# Patient Record
Sex: Female | Born: 1995 | Hispanic: No | Marital: Single | Smoking: Current some day smoker
Health system: Southern US, Community
[De-identification: ages and names within clinical notes are randomized; demographics above are authoritative.]

## PROBLEM LIST (undated history)

## (undated) DIAGNOSIS — T7840XA Allergy, unspecified, initial encounter: Secondary | ICD-10-CM

## (undated) DIAGNOSIS — H539 Unspecified visual disturbance: Secondary | ICD-10-CM

## (undated) DIAGNOSIS — F411 Generalized anxiety disorder: Secondary | ICD-10-CM

## (undated) HISTORY — DX: Generalized anxiety disorder: F41.1

## (undated) HISTORY — PX: TONSILLECTOMY: SUR1361

---
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
## 2015-10-10 ENCOUNTER — Other Ambulatory Visit: Payer: Self-pay

## 2015-12-19 ENCOUNTER — Encounter (HOSPITAL_COMMUNITY): Payer: Self-pay | Admitting: Nurse Practitioner

## 2015-12-19 ENCOUNTER — Emergency Department (HOSPITAL_COMMUNITY)
Admission: EM | Admit: 2015-12-19 | Discharge: 2015-12-19 | Disposition: A | Discharge status: Home / Self Care | Payer: BC Managed Care – PPO | Attending: Emergency Medicine | Admitting: Emergency Medicine

## 2015-12-19 DIAGNOSIS — Z79899 Other long term (current) drug therapy: Secondary | ICD-10-CM | POA: Insufficient documentation

## 2015-12-19 DIAGNOSIS — Z880 Allergy status to penicillin: Secondary | ICD-10-CM | POA: Diagnosis not present

## 2015-12-19 DIAGNOSIS — R202 Paresthesia of skin: Secondary | ICD-10-CM | POA: Diagnosis not present

## 2015-12-19 DIAGNOSIS — F17200 Nicotine dependence, unspecified, uncomplicated: Secondary | ICD-10-CM | POA: Diagnosis not present

## 2015-12-19 DIAGNOSIS — Z8669 Personal history of other diseases of the nervous system and sense organs: Secondary | ICD-10-CM | POA: Diagnosis not present

## 2015-12-19 DIAGNOSIS — M542 Cervicalgia: Secondary | ICD-10-CM

## 2015-12-19 DIAGNOSIS — R52 Pain, unspecified: Secondary | ICD-10-CM

## 2015-12-19 NOTE — ED Provider Notes (Signed)
CSN: [id number]     Arrival date & time 12/19/15  1700 History  By signing my name below, I, Erica Guzman, attest that this documentation has been prepared under the direction and in the presence of Shawn Joy, PA-C. Electronically Signed: Angelene GiovanniEmmanuella Guzman, ED Scribe. 12/19/2015. 5:30 PM.    Chief Complaint  Patient presents with  . Neck Pain   The history is provided by the patient. No language interpreter was used.   HPI Comments: Erica Guzman is a 20 y.o. female who presents to the Emergency Department complaining of gradually worsening numbness/tingling in upper and lower extremities onset 9 hours ago. Patient states, "it feels like my hands and feet are falling asleep." No alleviating factors noted. Pt has not tried any medications PTA. She states that these symptoms are normally consistent with her anxiety attacks but this episode did not resolve even after she took a break at work. She denies any recent falls, trauma, or surgeries. No daily medications. Patient denies fever/chills, nausea/vomiting, functional deficits, drug use, or any other complaints. Pt has upcoming PCP appointment with Dr. Duanne Guzman.     Past Medical History  Diagnosis Date  . Allergy     PCN  . Vision abnormalities     contacts   Past Surgical History  Procedure Laterality Date  . Tonsillectomy  Age 5   Family History  Problem Relation Age of Onset  . Bipolar disorder Mother    Social History  Substance Use Topics  . Smoking status: Current Some Day Smoker  . Smokeless tobacco: None  . Alcohol Use: Yes     Comment: not since thanksgiving 2012, 1-2 beers on occasion   OB History    No data available     Review of Systems  Constitutional: Negative for fever and chills.  HENT: Negative for trouble swallowing.   Respiratory: Negative for shortness of breath.   Cardiovascular: Negative for chest pain.  Gastrointestinal: Negative for nausea and vomiting.  Neurological: Negative for syncope.        Paresthesias in the hands and feet      Allergies  Penicillins  Home Medications   Prior to Admission medications   Medication Sig Start Date End Date Taking? Authorizing Provider  escitalopram (LEXAPRO) 20 MG tablet Take 1 tablet (20 mg total) by mouth daily after supper. 09/24/11 12/23/11  Erica CurryGayathri D Tadepalli, MD   BP 128/84 mmHg  Pulse 68  Temp(Src) 97.9 F (36.6 C) (Oral)  Resp 17  SpO2 100%  LMP 12/18/2015 Physical Exam  Constitutional: She is oriented to person, place, and time. She appears well-developed and well-nourished. No distress.  HENT:  Head: Normocephalic and atraumatic.  Eyes: Conjunctivae and EOM are normal.  Neck: Normal range of motion. Neck supple.  Cardiovascular: Normal rate and intact distal pulses.   Pulmonary/Chest: Effort normal. No respiratory distress.  Musculoskeletal: Normal range of motion.  Full ROM in all extremities and spine. No paraspinal tenderness.   Neurological: She is alert and oriented to person, place, and time. She has normal reflexes.  No sensory deficits. Strength 5/5 in all extremities. No gait disturbance. Coordination intact. Cranial nerves III-XII grossly intact. No facial droop.   Skin: Skin is warm and dry.  Psychiatric: She has a normal mood and affect. Her behavior is normal.  Nursing note and vitals reviewed.   ED Course  Procedures (including critical care time) DIAGNOSTIC STUDIES: Oxygen Saturation is 100% on RA, normal by my interpretation.    COORDINATION OF CARE: 5:28  PM- Pt advised of plan for treatment and pt agrees. Pt advised to rest and to take ibuprofen/naproxen for pain as needed. Recommended to honor appointment tomorrow.   MDM   Final diagnoses:  Neck pain  Total body pain    Leean Guzman presents with paresthesias in her hands and feet that began earlier today  Patient has no functional deficits and no abnormalities on exam. Patient's symptoms are consistent with anxiety reaction. Patient admits  to being under increased stress in the last few days. Patient advised to keep her appointment with her PCP and discuss this issue at that time. Return precautions discussed. Patient voiced understanding of these instructions and is comfortable with discharge.  I personally performed the services described in this documentation, which was scribed in my presence. The recorded information has been reviewed and is accurate.   Erica Pancoast, PA-C 12/21/15 0125  Erica Bale, MD 12/21/15 1106

## 2015-12-19 NOTE — ED Notes (Signed)
She reports numbness and tingling to her entire body since waking this morning. Then this afternoon while at work she began to have neck pain radiating down into her back. She reports history of panic attacks and this has felt similar but lasted longer. She denies any injuries. She took midol and aspirin with no relief

## 2015-12-19 NOTE — Discharge Instructions (Signed)
You have been seen today for neck and full body pain. There were no abnormalities on the physical exam. This is an encouraging finding. Be sure to keep your appointment with the PCP tomorrow. Return to ED should symptoms worsen. Take ibuprofen or naproxen for pain.

## 2016-01-05 ENCOUNTER — Ambulatory Visit: Payer: Self-pay | Admitting: Cardiology

## 2018-10-30 LAB — HM HEPATITIS C SCREENING LAB: HM Hepatitis Screen: NEGATIVE

## 2019-02-12 ENCOUNTER — Ambulatory Visit: Payer: 59

## 2019-02-12 ENCOUNTER — Encounter: Payer: Self-pay | Admitting: Orthopaedic Surgery

## 2019-02-12 ENCOUNTER — Other Ambulatory Visit: Payer: Self-pay

## 2019-02-12 ENCOUNTER — Ambulatory Visit (INDEPENDENT_AMBULATORY_CARE_PROVIDER_SITE_OTHER): Payer: 59 | Admitting: Orthopaedic Surgery

## 2019-02-12 DIAGNOSIS — M79602 Pain in left arm: Secondary | ICD-10-CM

## 2019-02-12 DIAGNOSIS — S161XXA Strain of muscle, fascia and tendon at neck level, initial encounter: Secondary | ICD-10-CM

## 2019-02-12 MED ORDER — TIZANIDINE HCL 4 MG PO TABS: 4.0000 mg | ORAL_TABLET | Freq: Four times a day (QID) | ORAL | 2 refills | Status: DC | PRN

## 2019-02-12 MED ORDER — CYCLOBENZAPRINE HCL 5 MG PO TABS: 5.0000 mg | ORAL_TABLET | Freq: Three times a day (TID) | ORAL | 3 refills | Status: DC | PRN

## 2019-02-12 MED ORDER — METHYLPREDNISOLONE 4 MG PO TBPK: ORAL_TABLET | ORAL | 0 refills | Status: DC

## 2019-02-12 MED ORDER — METHOCARBAMOL 500 MG PO TABS: 500.0000 mg | ORAL_TABLET | Freq: Four times a day (QID) | ORAL | 2 refills | Status: DC | PRN
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
## 2021-10-24 ENCOUNTER — Other Ambulatory Visit: Payer: Self-pay

## 2021-10-24 ENCOUNTER — Emergency Department (HOSPITAL_COMMUNITY)
Admission: EM | Admit: 2021-10-24 | Discharge: 2021-10-25 | Disposition: A | Discharge status: Home / Self Care | Payer: No Typology Code available for payment source | Attending: Emergency Medicine | Admitting: Emergency Medicine

## 2021-10-24 DIAGNOSIS — R7309 Other abnormal glucose: Secondary | ICD-10-CM | POA: Insufficient documentation

## 2021-10-24 DIAGNOSIS — R109 Unspecified abdominal pain: Secondary | ICD-10-CM | POA: Insufficient documentation

## 2021-10-24 DIAGNOSIS — R8289 Other abnormal findings on cytological and histological examination of urine: Secondary | ICD-10-CM | POA: Insufficient documentation

## 2021-10-24 DIAGNOSIS — R824 Acetonuria: Secondary | ICD-10-CM | POA: Insufficient documentation

## 2021-10-24 DIAGNOSIS — R110 Nausea: Secondary | ICD-10-CM | POA: Insufficient documentation

## 2021-10-24 DIAGNOSIS — N3001 Acute cystitis with hematuria: Secondary | ICD-10-CM | POA: Insufficient documentation

## 2021-10-24 DIAGNOSIS — R809 Proteinuria, unspecified: Secondary | ICD-10-CM | POA: Insufficient documentation

## 2021-10-24 DIAGNOSIS — M545 Low back pain, unspecified: Secondary | ICD-10-CM | POA: Insufficient documentation

## 2021-10-24 DIAGNOSIS — N739 Female pelvic inflammatory disease, unspecified: Secondary | ICD-10-CM | POA: Insufficient documentation

## 2021-10-24 LAB — URINALYSIS, ROUTINE W REFLEX MICROSCOPIC
Bilirubin Urine: NEGATIVE
Glucose, UA: NEGATIVE mg/dL
Ketones, ur: 80 mg/dL — AB
Nitrite: NEGATIVE
Protein, ur: 30 mg/dL — AB
Specific Gravity, Urine: 1.026 (ref 1.005–1.030)
pH: 5 (ref 5.0–8.0)

## 2021-10-24 LAB — WET PREP, GENITAL
Clue Cells Wet Prep HPF POC: NONE SEEN
Sperm: NONE SEEN
Trich, Wet Prep: NONE SEEN
WBC, Wet Prep HPF POC: <10 (ref <10)
Yeast Wet Prep HPF POC: NONE SEEN

## 2021-10-24 LAB — PREGNANCY, URINE: Preg Test, Ur: NEGATIVE

## 2021-10-24 MED ORDER — KETOROLAC TROMETHAMINE 30 MG/ML IJ SOLN
30.0000 mg | Freq: Once | INTRAMUSCULAR | Status: AC
  Administered 2021-10-24: 30 mg via INTRAVENOUS
  Filled 2021-10-24: qty 1

## 2021-10-24 MED ORDER — PHENAZOPYRIDINE HCL 100 MG PO TABS
95.0000 mg | ORAL_TABLET | Freq: Once | ORAL | Status: AC
  Administered 2021-10-24: 100 mg via ORAL
  Filled 2021-10-24: qty 1

## 2021-10-24 MED ORDER — ACETAMINOPHEN 500 MG PO TABS
1000.0000 mg | ORAL_TABLET | Freq: Once | ORAL | Status: AC
  Administered 2021-10-24: 1000 mg via ORAL
  Filled 2021-10-24: qty 2

## 2021-10-24 MED ORDER — MORPHINE SULFATE (PF) 4 MG/ML IV SOLN
4.0000 mg | Freq: Once | INTRAVENOUS | Status: AC
  Administered 2021-10-24: 4 mg via INTRAVENOUS
  Filled 2021-10-24: qty 1

## 2021-10-24 MED ORDER — SODIUM CHLORIDE 0.9 % IV BOLUS
1000.0000 mL | Freq: Once | INTRAVENOUS | Status: AC
  Administered 2021-10-24: 1000 mL via INTRAVENOUS

## 2021-10-24 MED ORDER — CEPHALEXIN 500 MG PO CAPS: 500.0000 mg | ORAL_CAPSULE | Freq: Four times a day (QID) | ORAL | 0 refills | Status: DC

## 2021-10-24 MED ORDER — ONDANSETRON HCL 4 MG/2ML IJ SOLN
4.0000 mg | Freq: Once | INTRAMUSCULAR | Status: AC
  Administered 2021-10-24: 4 mg via INTRAVENOUS
  Filled 2021-10-24: qty 2

## 2021-10-24 MED ORDER — PHENAZOPYRIDINE HCL 200 MG PO TABS: 200.0000 mg | ORAL_TABLET | Freq: Three times a day (TID) | ORAL | 0 refills | Status: DC

## 2021-10-24 MED ORDER — SODIUM CHLORIDE 0.9 % IV SOLN
1.0000 g | Freq: Once | INTRAVENOUS | Status: AC
  Administered 2021-10-24: 1 g via INTRAVENOUS
  Filled 2021-10-24: qty 10

## 2021-10-24 NOTE — Discharge Instructions (Addendum)
Take Keflex 4 times daily for the next 5 days.Take pyridium 3x daily for 2 days ?Check MyChart for the result of the gonorrhea chlamydia test in the next 3 days. ?Information above for gynecology, schedule follow-up appointment for reevaluation. ?If symptoms persist please follow-up with your primary in the next few days for reevaluation. ?If you start vomiting, having worsening fevers, the pain moves up your flank and you are unable to tolerate oral medicine you should return back to the ED for additional evaluation. ?

## 2021-10-24 NOTE — ED Provider Notes (Addendum)
[facility] EMERGENCY DEPARTMENT Provider Note   CSN: [id number] Arrival date & time: 10/24/21  [id number]     History  Chief Complaint  Patient presents with   Dysuria    Erica Guzman is a 26 y.o. female.   Dysuria  Patient is a 26 year old female with medical history notable for anxiety, bipolar 2 disorder presenting with dysuria.  Symptoms started 4 to 5 days ago.  She reports the day prior she did have unprotected intercourse, denies any specific vaginal discharge.  The pain is primarily with urination, has dysuria that started today.  Denies any dyspareunia, some nausea but no vomiting.  Denies any fevers at home, she has tried over-the-counter yeast infection medicine and Azo without any relief of symptoms.  Denies any specific abdominal pain, pain feels like sharp glass in the vagina.  No history of STDs.  Home Medications Prior to Admission medications   Medication Sig Start Date End Date Taking? Authorizing Provider  cephALEXin (KEFLEX) 500 MG capsule Take 1 capsule (500 mg total) by mouth 4 (four) times daily. 10/24/21  Yes Sherrill Raring, PA-C  cyclobenzaprine (FLEXERIL) 5 MG tablet Take 1-2 tablets (5-10 mg total) by mouth 3 (three) times daily as needed for muscle spasms. 02/12/19   Leandrew Koyanagi, MD  escitalopram (LEXAPRO) 20 MG tablet Take 1 tablet (20 mg total) by mouth daily after supper. 09/24/11 12/23/11  Leonides Grills, MD  methocarbamol (ROBAXIN) 500 MG tablet Take 1 tablet (500 mg total) by mouth every 6 (six) hours as needed for muscle spasms. 02/12/19   Leandrew Koyanagi, MD  methylPREDNISolone (MEDROL DOSEPAK) 4 MG TBPK tablet Use as directed 02/12/19   Leandrew Koyanagi, MD  tiZANidine (ZANAFLEX) 4 MG tablet Take 1 tablet (4 mg total) by mouth every 6 (six) hours as needed for muscle spasms. 02/12/19   Leandrew Koyanagi, MD      Allergies    Penicillins    Review of Systems   Review of Systems  Genitourinary:  Positive for dysuria.   Physical Exam Updated  Vital Signs BP 125/87    Pulse 87    Temp 99.1 F (37.3 C) (Oral)    Resp 17    SpO2 98%  Physical Exam Vitals and nursing note reviewed. Exam conducted with a chaperone present.  Constitutional:      Appearance: Normal appearance.  HENT:     Head: Normocephalic and atraumatic.  Eyes:     General: No scleral icterus.       Right eye: No discharge.        Left eye: No discharge.     Extraocular Movements: Extraocular movements intact.     Pupils: Pupils are equal, round, and reactive to light.  Cardiovascular:     Rate and Rhythm: Normal rate and regular rhythm.     Pulses: Normal pulses.     Heart sounds: Normal heart sounds. No murmur heard.   No friction rub. No gallop.  Pulmonary:     Effort: Pulmonary effort is normal. No respiratory distress.     Breath sounds: Normal breath sounds.  Abdominal:     General: Abdomen is flat. Bowel sounds are normal. There is no distension.     Palpations: Abdomen is soft.     Tenderness: There is no abdominal tenderness. There is no right CVA tenderness or left CVA tenderness.  Genitourinary:    Comments: Some erythema to the urethra.  No adnexal tenderness, no cervical wall motion tenderness.  Skin:    General: Skin is warm and dry.     Coloration: Skin is not jaundiced.  Neurological:     Mental Status: She is alert. Mental status is at baseline.     Coordination: Coordination normal.    ED Results / Procedures / Treatments   Labs (all labs ordered are listed, but only abnormal results are displayed) Labs Reviewed  URINALYSIS, ROUTINE W REFLEX MICROSCOPIC - Abnormal; Notable for the following components:      Result Value   APPearance HAZY (*)    Hgb urine dipstick SMALL (*)    Ketones, ur 80 (*)    Protein, ur 30 (*)    Leukocytes,Ua TRACE (*)    Bacteria, UA RARE (*)    All other components within normal limits  WET PREP, GENITAL  PREGNANCY, URINE  GC/CHLAMYDIA PROBE AMP ([place]) NOT AT [facility]     EKG None  Radiology No results found.  Procedures Procedures    Medications Ordered in ED Medications  cefTRIAXone (ROCEPHIN) 1 g in sodium chloride 0.9 % 100 mL IVPB (has no administration in time range)  acetaminophen (TYLENOL) tablet 1,000 mg (1,000 mg Oral Given 10/24/21 2314)  sodium chloride 0.9 % bolus 1,000 mL (1,000 mLs Intravenous New Bag/Given 10/24/21 2314)  ketorolac (TORADOL) 30 MG/ML injection 30 mg (30 mg Intravenous Given 10/24/21 2315)    ED Course/ Medical Decision Making/ A&P                           Medical Decision Making Amount and/or Complexity of Data Reviewed Labs: ordered.  Risk OTC drugs. Prescription drug management.   This patient presents to the ED for concern of dysuria/pelvic pain this involves an extensive number of treatment options, and is a complaint that carries with it a high risk of complications and morbidity.  The differential diagnosis includes UTI, pyelonephritis, PID, ovarian cyst, ovarian torsion, ectopic pregnancy, other  Additional history obtained:   I reviewed previous gynecology visit, patient has one 54-year-old child delivered 3 years ago.  Has not had routine follow-up since then.    Lab Tests:  I ordered, viewed, and personally interpreted labs.  The pertinent results include: Blood unremarkable.  Patient is not pregnant, urine is notable for trace hemoglobin, ketonuria, proteinuria and trace leukocytes.  There are greater than 10 white blood cells noted.  Cardiac chlamydia pending.    ECG/Cardiac monitoring:    The patient was maintained on a cardiac monitor.  Visualized monitor strip which showed NSR with rate of 87 per my interpretation.    Medicines ordered and prescription drug management:  I ordered medication including: Toradol, Tylenol, liter fluid bolus, Rocephin  I have reviewed the patients home medicines and have made adjustments as needed   Test Considered:  Considered laboratory work-up  initially, however patient is tolerating oral intake.  She is not having any focal abdominal tenderness, additionally do not feel imaging would be beneficial given the lack of focal tenderness.  I considered ultimately think ovarian torsion, ovarian cyst unlikely.  Patient also is not pregnant so not worried about ruptured ectopic pregnancy.  PID was considered but ultimately feel unlikely.  Reevaluation:  After the interventions noted above, I reevaluated the patient and found patient pain is improved.   Problems addressed / ED Course: 26 year old female presenting with dysuria and pelvic pain.  Physical exam largely unremarkable, there is some erythema around the urethra but there is no adnexal  tenderness or cervical motion tenderness.  Additionally very trace amounts of vaginal discharge.  Abdomen is soft without any peritoneal signs, there is no CVA tenderness suggestive of nephrolithiasis at this time.  Initial vitals during triage were notable for mild tachycardia at 102.  Patient's temperature is elevated but not technically febrile at 99 orally.  Considered sepsis but patient is young, otherwise healthy and on reevaluation her heart rate is 87, she is not febrile and tolerating oral intake.  Pyelo for this reason is also unlikely although considered.  Discussed with patient additional imaging including and laboratory work-up, she states she would prefer to treat as a UTI and return if things change or worsen.  I think that is reasonable.  Given 1 L fluid bolus given there is proteinuria and ketonuria in the urine suggestive of dehydration/reactionary ketosis.  Rocephin given, patient feels improved with pain medicine. I feel appropriate for D/C with return precautions.   Patient reports rash to penicillin, has not had it since being a child.  Denies any history of anaphylaxis, I think the likeliness of cross-reactivity with cephalosporins unlikely.  Social Determinants of Health: No  consistent PCP   Disposition:   After consideration of the diagnostic results and the patients response to treatment, I feel that the patent would benefit from D/C.            Final Clinical Impression(s) / ED Diagnoses Final diagnoses:  Acute cystitis with hematuria    Rx / DC Orders ED Discharge Orders          Ordered    cephALEXin (KEFLEX) 500 MG capsule  4 times daily        10/24/21 2335              Sherrill Raring, PA-C 10/24/21 2334    Sherrill Raring, PA-C 10/24/21 2336    Carmin Muskrat, MD 10/25/21 0009

## 2021-10-24 NOTE — ED Triage Notes (Signed)
Pt with painful urination and spotting since sexual intercourse on Thursday. Denies burning or itching to genitals, but states it feels like glass when urinating as well as more frequently. Using yeast infection creams and OTC UTI meds without relief. Hx irregular periods, LMP end of January.  ?

## 2021-10-24 NOTE — ED Provider Triage Note (Signed)
Emergency Medicine Provider Triage Evaluation Note ? ?Erica Guzman , a 26 y.o. female  was evaluated in triage.  Pt complains of dysuria increased urinary frequency 24 hours following sexual intercourse on Thursday.  Tried yeast infection medication and OTC UTI meds without any relief.  LMP end of January.  Denies possibility of pregnancy.  Denies suprapubic pain.  Denies fever ? ?Review of Systems  ?Positive: As above ?Negative: As above ? ?Physical Exam  ?BP (!) 122/91   Pulse (!) 102   Temp 99 ?F (37.2 ?C) (Oral)   Resp 16   SpO2 100%  ?Gen:   Awake, no distress   ?Resp:  Normal effort  ?MSK:   Moves extremities without difficulty  ?Other:  Non--TTP abdomen, afebrile ? ?Medical Decision Making  ?Medically screening exam initiated at 6:33 PM.  Appropriate orders placed.  Erica Guzman was informed that the remainder of the evaluation will be completed by another provider, this initial triage assessment does not replace that evaluation, and the importance of remaining in the ED until their evaluation is complete. ? ?Labs ordered ?  ?Cecil Cobbs, PA-C ?10/24/21 1840 ? ?

## 2021-10-25 ENCOUNTER — Encounter (HOSPITAL_COMMUNITY): Payer: Self-pay

## 2021-10-25 ENCOUNTER — Emergency Department (HOSPITAL_COMMUNITY)
Admission: EM | Admit: 2021-10-25 | Discharge: 2021-10-25 | Disposition: A | Discharge status: Home / Self Care | Payer: No Typology Code available for payment source | Source: Home / Self Care | Attending: Emergency Medicine | Admitting: Emergency Medicine

## 2021-10-25 ENCOUNTER — Emergency Department (HOSPITAL_COMMUNITY): Payer: No Typology Code available for payment source

## 2021-10-25 DIAGNOSIS — N730 Acute parametritis and pelvic cellulitis: Secondary | ICD-10-CM

## 2021-10-25 DIAGNOSIS — N739 Female pelvic inflammatory disease, unspecified: Secondary | ICD-10-CM | POA: Insufficient documentation

## 2021-10-25 DIAGNOSIS — M545 Low back pain, unspecified: Secondary | ICD-10-CM | POA: Insufficient documentation

## 2021-10-25 DIAGNOSIS — R109 Unspecified abdominal pain: Secondary | ICD-10-CM | POA: Insufficient documentation

## 2021-10-25 DIAGNOSIS — N3001 Acute cystitis with hematuria: Secondary | ICD-10-CM | POA: Diagnosis not present

## 2021-10-25 DIAGNOSIS — R7309 Other abnormal glucose: Secondary | ICD-10-CM | POA: Insufficient documentation

## 2021-10-25 DIAGNOSIS — R1030 Lower abdominal pain, unspecified: Secondary | ICD-10-CM

## 2021-10-25 DIAGNOSIS — R110 Nausea: Secondary | ICD-10-CM | POA: Insufficient documentation

## 2021-10-25 LAB — WET PREP, GENITAL
Clue Cells Wet Prep HPF POC: NONE SEEN
Sperm: NONE SEEN
Trich, Wet Prep: NONE SEEN
WBC, Wet Prep HPF POC: <10 (ref <10)
Yeast Wet Prep HPF POC: NONE SEEN

## 2021-10-25 LAB — LIPASE, BLOOD: Lipase: 42 U/L (ref 11–51)

## 2021-10-25 LAB — GC/CHLAMYDIA PROBE AMP (~~LOC~~) NOT AT ARMC
Chlamydia: NEGATIVE
Comment: NEGATIVE
Comment: NORMAL
Neisseria Gonorrhea: NEGATIVE

## 2021-10-25 LAB — COMPREHENSIVE METABOLIC PANEL
ALT: 13 U/L (ref 0–44)
AST: 19 U/L (ref 15–41)
Albumin: 3.7 g/dL (ref 3.5–5.0)
Alkaline Phosphatase: 49 U/L (ref 38–126)
Anion gap: 5 (ref 5–15)
BUN: 9 mg/dL (ref 6–20)
CO2: 25 mmol/L (ref 22–32)
Calcium: 8.2 mg/dL — ABNORMAL LOW (ref 8.9–10.3)
Chloride: 108 mmol/L (ref 98–111)
Creatinine, Ser: 0.63 mg/dL (ref 0.44–1.00)
GFR, Estimated: >60 mL/min (ref >60)
Glucose, Bld: 115 mg/dL — ABNORMAL HIGH (ref 70–99)
Potassium: 3.2 mmol/L — ABNORMAL LOW (ref 3.5–5.1)
Sodium: 138 mmol/L (ref 135–145)
Total Bilirubin: 0.3 mg/dL (ref 0.3–1.2)
Total Protein: 6.5 g/dL (ref 6.5–8.1)

## 2021-10-25 LAB — CBC WITH DIFFERENTIAL/PLATELET
Abs Immature Granulocytes: 0.01 10*3/uL (ref 0.00–0.07)
Basophils Absolute: 0 10*3/uL (ref 0.0–0.1)
Basophils Relative: 1 %
Eosinophils Absolute: 0 10*3/uL (ref 0.0–0.5)
Eosinophils Relative: 1 %
HCT: 43.1 % (ref 36.0–46.0)
Hemoglobin: 14.3 g/dL (ref 12.0–15.0)
Immature Granulocytes: 0 %
Lymphocytes Relative: 28 %
Lymphs Abs: 1.2 10*3/uL (ref 0.7–4.0)
MCH: 30 pg (ref 26.0–34.0)
MCHC: 33.2 g/dL (ref 30.0–36.0)
MCV: 90.4 fL (ref 80.0–100.0)
Monocytes Absolute: 0.4 10*3/uL (ref 0.1–1.0)
Monocytes Relative: 9 %
Neutro Abs: 2.7 10*3/uL (ref 1.7–7.7)
Neutrophils Relative %: 61 %
Platelets: 186 10*3/uL (ref 150–400)
RBC: 4.77 MIL/uL (ref 3.87–5.11)
RDW: 13 % (ref 11.5–15.5)
WBC: 4.3 10*3/uL (ref 4.0–10.5)
nRBC: 0 % (ref 0.0–0.2)

## 2021-10-25 LAB — URINALYSIS, ROUTINE W REFLEX MICROSCOPIC: Bacteria, UA: NONE SEEN

## 2021-10-25 LAB — PREGNANCY, URINE: Preg Test, Ur: NEGATIVE

## 2021-10-25 MED ORDER — METRONIDAZOLE 500 MG PO TABS
500.0000 mg | ORAL_TABLET | Freq: Once | ORAL | Status: AC
  Administered 2021-10-25: 500 mg via ORAL
  Filled 2021-10-25: qty 1

## 2021-10-25 MED ORDER — STERILE WATER FOR INJECTION IJ SOLN
INTRAMUSCULAR | Status: AC
  Filled 2021-10-25: qty 10

## 2021-10-25 MED ORDER — POTASSIUM CHLORIDE CRYS ER 20 MEQ PO TBCR
30.0000 meq | EXTENDED_RELEASE_TABLET | Freq: Once | ORAL | Status: AC
  Administered 2021-10-25: 30 meq via ORAL
  Filled 2021-10-25: qty 1

## 2021-10-25 MED ORDER — ONDANSETRON HCL 4 MG PO TABS: 4.0000 mg | ORAL_TABLET | Freq: Four times a day (QID) | ORAL | 0 refills | Status: DC

## 2021-10-25 MED ORDER — PHENAZOPYRIDINE HCL 100 MG PO TABS
100.0000 mg | ORAL_TABLET | Freq: Three times a day (TID) | ORAL | Status: DC
  Administered 2021-10-25: 100 mg via ORAL
  Filled 2021-10-25: qty 1

## 2021-10-25 MED ORDER — DOXYCYCLINE HYCLATE 100 MG PO CAPS: 100.0000 mg | ORAL_CAPSULE | Freq: Two times a day (BID) | ORAL | 0 refills | Status: DC

## 2021-10-25 MED ORDER — FENTANYL CITRATE PF 50 MCG/ML IJ SOSY
50.0000 ug | PREFILLED_SYRINGE | Freq: Once | INTRAMUSCULAR | Status: AC
Start: 2021-10-25 — End: 2021-10-25
  Administered 2021-10-25: 50 ug via INTRAVENOUS
  Filled 2021-10-25: qty 1

## 2021-10-25 MED ORDER — KETOROLAC TROMETHAMINE 15 MG/ML IJ SOLN
15.0000 mg | Freq: Once | INTRAMUSCULAR | Status: AC
  Administered 2021-10-25: 15 mg via INTRAVENOUS
  Filled 2021-10-25: qty 1

## 2021-10-25 MED ORDER — DOXYCYCLINE HYCLATE 100 MG PO TABS
100.0000 mg | ORAL_TABLET | Freq: Once | ORAL | Status: AC
  Administered 2021-10-25: 100 mg via ORAL
  Filled 2021-10-25: qty 1

## 2021-10-25 MED ORDER — METRONIDAZOLE 500 MG PO TABS: 500.0000 mg | ORAL_TABLET | Freq: Two times a day (BID) | ORAL | 0 refills | Status: DC

## 2021-10-25 MED ORDER — OXYCODONE-ACETAMINOPHEN 5-325 MG PO TABS: 1.0000 | ORAL_TABLET | Freq: Four times a day (QID) | ORAL | 0 refills | Status: DC | PRN

## 2021-10-25 MED ORDER — CEFTRIAXONE SODIUM 1 G IJ SOLR
500.0000 mg | Freq: Once | INTRAMUSCULAR | Status: AC
  Administered 2021-10-25: 500 mg via INTRAMUSCULAR
  Filled 2021-10-25: qty 10

## 2021-10-25 MED ORDER — LACTATED RINGERS IV BOLUS
1000.0000 mL | Freq: Once | INTRAVENOUS | Status: AC
  Administered 2021-10-25: 1000 mL via INTRAVENOUS

## 2021-10-25 NOTE — Discharge Instructions (Addendum)
You were seen here today for evaluation of your pain. It was discovered that you have Pelvic Inflammatory Disease. Additional information on this included in your discharge paperwork. I have prescribed you 2 antibiotics to take outpatient.  1 is doxycycline and 1 is metronidazole.  You will take these both 2 times daily for the next 14 days.  You can continue to take Azo as needed for your painful with urination although I likely think this pain is from your inflammation of your pelvic region.  I have additionally prescribed you narcotic pain medication for your pain.  Please do not operate heavy machinery or drive on this medication as it can make you sleepy. Narcotic pain medication can make you nauseous, so I have prescribed you nausea medication to take as needed.  Need to follow-up with your OB/GYN for reassessment soon.  Please call tomorrow to schedule an appointment ASAP.  Please continue to drinkplenty of water. Please pick up a thermometer to record your temperatures. If you have a fever, worsening pain, or vomiting please return to the ED.  ? ?Contact a doctor if: ?You have more fluid or fluid that is not normal coming from your vagina. ?Your pain does not get better. ?You have a fever or chills. ?You cannot take your medicines. ?Your partner has an STI. ?You have pain when you pee. ? ?Get help right away if: ?You have more pain in the belly area. ?Your symptoms are getting worse. ?You are not better in 72 hours with treatment. ?

## 2021-10-25 NOTE — ED Provider Notes (Signed)
[facility]-EMERGENCY DEPT Provider Note   CSN: [id number] Arrival date & time: 10/25/21  1235     History Chief Complaint  Patient presents with   Dysuria    Rosena Bartle is a 26 y.o. female with history of anxiety and depression presents to the ED for evaluation of worsening dysuria since yesterday.  Yesterday, the patient was seen at [facility] emergency department and was put on Keflex for possible UTI.  The patient reports she has had 1 dose, but has had significantly worsening pain with urinating and it feels like "shards of glass".  She reports that she now has pain into her abdomen and in her lower back as well.  She has tried Azo and yeast cream without any improvement of pain.  She reports that she has had some chills but does not know she has had a fever.  Denies any urinary fecal incontinence.  Denies any urinary retention.  She denies any vaginal bleeding or any vaginal discharge or lesions.  She reports she had unprotected intercourse on Thursday, but is not concerned for any gonorrhea or chlamydia.  Rash with penicillins.   Dysuria Associated symptoms: abdominal pain and nausea   Associated symptoms: no fever and no vaginal discharge       Home Medications Prior to Admission medications   Medication Sig Start Date End Date Taking? Authorizing Provider  cephALEXin (KEFLEX) 500 MG capsule Take 1 capsule (500 mg total) by mouth 4 (four) times daily. 10/24/21   Theron Arista, PA-C  cyclobenzaprine (FLEXERIL) 5 MG tablet Take 1-2 tablets (5-10 mg total) by mouth 3 (three) times daily as needed for muscle spasms. 02/12/19   Tarry Kos, MD  escitalopram (LEXAPRO) 20 MG tablet Take 1 tablet (20 mg total) by mouth daily after supper. 09/24/11 12/23/11  Gayland Curry, MD  methocarbamol (ROBAXIN) 500 MG tablet Take 1 tablet (500 mg total) by mouth every 6 (six) hours as needed for muscle spasms. 02/12/19   Tarry Kos, MD  methylPREDNISolone (MEDROL DOSEPAK) 4  MG TBPK tablet Use as directed 02/12/19   Tarry Kos, MD  phenazopyridine (PYRIDIUM) 200 MG tablet Take 1 tablet (200 mg total) by mouth 3 (three) times daily. 10/24/21   Theron Arista, PA-C  tiZANidine (ZANAFLEX) 4 MG tablet Take 1 tablet (4 mg total) by mouth every 6 (six) hours as needed for muscle spasms. 02/12/19   Tarry Kos, MD      Allergies    Penicillins    Review of Systems   Review of Systems  Constitutional:  Positive for chills. Negative for fever.  Respiratory:  Negative for shortness of breath.   Cardiovascular:  Negative for chest pain.  Gastrointestinal:  Positive for abdominal pain and nausea.  Genitourinary:  Positive for dysuria. Negative for difficulty urinating, enuresis, vaginal bleeding, vaginal discharge and vaginal pain.  Musculoskeletal:  Positive for back pain.   Physical Exam Updated Vital Signs BP (!) 146/90 (BP Location: Right Arm)    Pulse (!) 120    Temp 98.4 F (36.9 C) (Oral)    Resp 18    SpO2 100%  Physical Exam Vitals and nursing note reviewed. Exam conducted with a chaperone present (Faith, Tech).  Constitutional:      Appearance: Normal appearance. She is not toxic-appearing.     Comments: Tearful, uncomfortable appearing  HENT:     Head: Normocephalic and atraumatic.     Mouth/Throat:     Mouth: Mucous membranes are moist.  Pharynx: No oropharyngeal exudate or posterior oropharyngeal erythema.  Eyes:     General: No scleral icterus. Cardiovascular:     Rate and Rhythm: Normal rate and regular rhythm.  Pulmonary:     Effort: Pulmonary effort is normal. No respiratory distress.     Breath sounds: Normal breath sounds. No wheezing.  Abdominal:     General: Abdomen is flat. Bowel sounds are normal.     Palpations: Abdomen is soft.     Tenderness: There is abdominal tenderness. There is no right CVA tenderness, left CVA tenderness, guarding or rebound.     Comments: Diffuse abdominal tenderness without any guarding or rebound.   Genitourinary:    Exam position: Knee-chest position.     Labia:        Right: No rash, tenderness or lesion.        Left: No rash, tenderness or lesion.      Urethra: Urethral swelling present.     Vagina: Vaginal discharge present. No tenderness, bleeding or lesions.     Cervix: Discharge, friability and lesion present. No cervical motion tenderness.     Uterus: Not tender.      Comments: Enlarged cervix with white overgrowth is unable to be wiped away with cotton swab.  Cervical friability.  The cervical office spitting out a green serous discharge.  The patient does not have any CMT though.  Bimanual exam performed and ovaries not palpable.  Uterus nontender.  The patient's urethra is mildly edematous and erythematous. See picture  for more detail of the patient's cervix.  Musculoskeletal:        General: Tenderness present. No deformity.     Cervical back: Normal range of motion.     Comments: Bilateral low lumbar paraspinal tenderness to palpation. No overlying skin changes or warmth noted to the area. No deformity or step off noted. No midline cervical, thoracic or lumbar tenderness.   Skin:    General: Skin is warm and dry.     Findings: No rash.  Neurological:     General: No focal deficit present.     Mental Status: She is alert. Mental status is at baseline.     Motor: No weakness.       ED Results / Procedures / Treatments   Labs (all labs ordered are listed, but only abnormal results are displayed) Labs Reviewed  URINALYSIS, ROUTINE W REFLEX MICROSCOPIC - Abnormal; Notable for the following components:      Result Value   Color, Urine RED (*)    Glucose, UA   (*)    Value: TEST NOT REPORTED DUE TO COLOR INTERFERENCE OF URINE PIGMENT   Hgb urine dipstick   (*)    Value: TEST NOT REPORTED DUE TO COLOR INTERFERENCE OF URINE PIGMENT   Bilirubin Urine   (*)    Value: TEST NOT REPORTED DUE TO COLOR INTERFERENCE OF URINE PIGMENT   Ketones, ur   (*)    Value: TEST  NOT REPORTED DUE TO COLOR INTERFERENCE OF URINE PIGMENT   Protein, ur   (*)    Value: TEST NOT REPORTED DUE TO COLOR INTERFERENCE OF URINE PIGMENT   Nitrite   (*)    Value: TEST NOT REPORTED DUE TO COLOR INTERFERENCE OF URINE PIGMENT   Leukocytes,Ua   (*)    Value: TEST NOT REPORTED DUE TO COLOR INTERFERENCE OF URINE PIGMENT   All other components within normal limits  COMPREHENSIVE METABOLIC PANEL - Abnormal; Notable for the following components:  Potassium 3.2 (*)    Glucose, Bld 115 (*)    Calcium 8.2 (*)    All other components within normal limits  WET PREP, GENITAL  PREGNANCY, URINE  CBC WITH DIFFERENTIAL/PLATELET  LIPASE, BLOOD    EKG None  Radiology US Transvaginal Non-OB  Result Date: 10/25/2021 CLINICAL DATA:  Pain EXAM: TRANSABDOMINAL AND TRANSVAGINAL ULTRASOUND OF PELVIS DOPPLER ULTRASOUND OF OVARIES TECHNIQUE: Both transabdominal and transvaginal ultrasound examinations of the pelvis were performed. Transabdominal technique was performed for global imaging of the pelvis including uterus, ovaries, adnexal regions, and pelvic cul-de-sac. It was necessary to proceed with endovaginal exam following the transabdominal exam to visualize the uterus endometrium ovaries. Color and duplex Doppler ultrasound was utilized to evaluate blood flow to the ovaries. COMPARISON:  CT 10/25/2021 FINDINGS: Uterus Measurements: 7.3 x 4.4 x 5.1 cm = volume: 86.2 mL. No fibroids or other mass visualized. Uterus is retroverted. Soft tissue enlargement of the cervical region without well-defined mass. Nabothian cysts in the cervix. Endometrium Thickness: 15 mm.  No focal abnormality visualized. Right ovary Not seen Left ovary Measurements: 3.6 x 1.6 x 1.9 cm = volume: 5.9 mL. Normal appearance/no adnexal mass. Pulsed Doppler evaluation of the left ovary demonstrates normal low-resistance arterial and venous waveforms. Other findings Moderate free fluid in the pelvis. IMPRESSION: 1. Nonvisualized right  ovary. Negative for left ovarian mass or torsion 2. Soft tissue fullness of the cervix without well-defined mass lesion, may be correlated with Gyn exam 3. Moderate free fluid in the pelvis Electronically Signed   By: Jasmine Pang M.D.   On: 10/25/2021 17:39   [place] Art/Ven Flow Abd Pelv Doppler  Result Date: 10/25/2021 CLINICAL DATA:  Pain EXAM: TRANSABDOMINAL AND TRANSVAGINAL ULTRASOUND OF PELVIS DOPPLER ULTRASOUND OF OVARIES TECHNIQUE: Both transabdominal and transvaginal ultrasound examinations of the pelvis were performed. Transabdominal technique was performed for global imaging of the pelvis including uterus, ovaries, adnexal regions, and pelvic cul-de-sac. It was necessary to proceed with endovaginal exam following the transabdominal exam to visualize the uterus endometrium ovaries. Color and duplex Doppler ultrasound was utilized to evaluate blood flow to the ovaries. COMPARISON:  CT 10/25/2021 FINDINGS: Uterus Measurements: 7.3 x 4.4 x 5.1 cm = volume: 86.2 mL. No fibroids or other mass visualized. Uterus is retroverted. Soft tissue enlargement of the cervical region without well-defined mass. Nabothian cysts in the cervix. Endometrium Thickness: 15 mm.  No focal abnormality visualized. Right ovary Not seen Left ovary Measurements: 3.6 x 1.6 x 1.9 cm = volume: 5.9 mL. Normal appearance/no adnexal mass. Pulsed Doppler evaluation of the left ovary demonstrates normal low-resistance arterial and venous waveforms. Other findings Moderate free fluid in the pelvis. IMPRESSION: 1. Nonvisualized right ovary. Negative for left ovarian mass or torsion 2. Soft tissue fullness of the cervix without well-defined mass lesion, may be correlated with Gyn exam 3. Moderate free fluid in the pelvis Electronically Signed   By: Jasmine Pang M.D.   On: 10/25/2021 17:39   CT Renal Stone Study  Result Date: 10/25/2021 CLINICAL DATA:  Pain with urination, UTI, flank pain. EXAM: CT ABDOMEN AND PELVIS WITHOUT CONTRAST  TECHNIQUE: Multidetector CT imaging of the abdomen and pelvis was performed following the standard protocol without IV contrast. RADIATION DOSE REDUCTION: This exam was performed according to the departmental dose-optimization program which includes automated exposure control, adjustment of the mA and/or kV according to patient size and/or use of iterative reconstruction technique. COMPARISON:  None. FINDINGS: Lower chest: Calcified granuloma in the right middle lobe. Heart size  normal. No pericardial or pleural effusion. Hepatobiliary: Liver and gallbladder are unremarkable. No biliary ductal dilatation. Pancreas: Negative. Spleen: Negative. Adrenals/Urinary Tract: Adrenal glands and kidneys are unremarkable. No urinary stones. Ureters are decompressed. Bladder is low in volume. Stomach/Bowel: Stomach, small bowel, appendix and colon are unremarkable. Vascular/Lymphatic: Vascular structures are unremarkable. No pathologically enlarged lymph nodes. Reproductive: Uterus is visualized. A well-circumscribed rounded structure measuring 4.2 cm in diameter is seen in the region of the cervix/vagina and may represent a contraceptive device. No adnexal mass. Other: Small pelvic free fluid. No definite adnexal mass. Small perihepatic ascites. Musculoskeletal: None. IMPRESSION: 1. No urinary stones or obstruction. 2. Well-circumscribed rounded fullness in the cervical region may represent a contraceptive device. If the patient does not report an indwelling device such as a cervical cap, pelvic ultrasound is recommended in further evaluation. 3. Small pelvic free fluid with tiny right perihepatic ascites. Pelvic ultrasound may be helpful in further evaluation of the ovaries, as clinically indicated. Electronically Signed   By: Leanna BattlesMelinda  Blietz M.D.   On: 10/25/2021 15:48    Procedures Procedures   Medications Ordered in ED Medications  lactated ringers bolus 1,000 mL (0 mLs Intravenous Stopped 10/25/21 1518)  fentaNYL  (SUBLIMAZE) injection 50 mcg (50 mcg Intravenous Given 10/25/21 1333)  potassium chloride (KLOR-CON M) CR tablet 30 mEq (30 mEq Oral Given 10/25/21 1520)  cefTRIAXone (ROCEPHIN) injection 500 mg (500 mg Intramuscular Given 10/25/21 1926)  sterile water (preservative free) injection (  Given 10/25/21 1929)  ketorolac (TORADOL) 15 MG/ML injection 15 mg (15 mg Intravenous Given 10/25/21 2058)  metroNIDAZOLE (FLAGYL) tablet 500 mg (500 mg Oral Given 10/25/21 2123)  doxycycline (VIBRA-TABS) tablet 100 mg (100 mg Oral Given 10/25/21 2123)    ED Course/ Medical Decision Making/ A&P                           Medical Decision Making Amount and/or Complexity of Data Reviewed Labs: ordered. Radiology: ordered.  Risk Prescription drug management.   26 year old female presents emergency department for evaluation of dysuria, abdominal pain, and back pain.  Differential diagnosis includes but is not limited to UTI, renal stone, PID, ovarian torsion, ovarian cyst, STD.  Vital signs are stable.  Patient afebrile, normotensive, satting well on room air.  Normal pulse rate.  Physical exam significant for a diffusely tender abdomen without any guarding or rebound.  Bilateral paraspinal low lumbar tenderness palpation without any increased erythema or warmth noted.  No bony step-offs or abnormalities.  Pelvic exam shows Enlarged cervix with white overgrowth is unable to be wiped away with cotton swab.  Cervical friability.  The cervical office spitting out a green serous discharge.  The patient does not have any CMT though.  Bimanual exam performed and ovaries not palpable.  Uterus nontender. The patient's urethra is mildly edematous and erythematous. See picture  for more detail of the patient's cervix.  The patient was [facility] yesterday for dysuria.  Pelvic exam performed and showed only some erythema to the patient's urethra, but nothing on the cervical exam.  Because of worsening  pain, lab work and imaging performed.  I independently reviewed and interpreted the patient's labs and imaging and agree with radiologist interpretation.  CMP shows decreased potassium at 3.2 that was replenished orally.  Mildly elevated glucose at 115 although not fasting.  Mildly decreased calcium at 8.2.  Normal LFTs.  No other electrolyte abnormality.  Pregnancy test negative.  Lipase normal.  CBC shows no leukocytosis or anemia.  Wet prep was negative.  Urinalysis shows red urine that was unable to be processed due to the blood.  CT renal stone study shows no urinary stones or obstruction.  There is a well circumcised rounded fullness in the cervical region and a represent a contraceptive device.  There is a small amount of pelvic free fluid and a tiny right perihepatic ascites.  Recommended pelvic ultrasound.  Given the CT findings, that is when I did a pelvic exam and noticed the growths on the cervix and an enlarged cervix.  She denies any new Mirena or IUD.  I do not see any strings nor was there any IUD seen on ultrasound.  Do not see any foreign body in the vagina as well.  Ultrasound was ordered.  There was a nonvisualized right ovary but negative for left ovarian mass or torsion.  Soft tissue fullness of the cervix without well-defined mass or lesion.  Moderate free fluid in the pelvis.  At this time, I do not think this is ovarian torsion as the patient only had pain with urination and some abdominal pain.  Discussed with my attending and he also thinks is less likely ovarian torsion and says she is safe for discharge without visualization of the right ovary.  I discussed this case with Dr. Jaynie Collins with OBGYN, She discussed that the patient likelyy has PID and recommended out patient follow up with the patient's OBGYN.   The patient was reporting that she was having some nausea without vomiting.  She was able to eat a sandwich but still reports she has some nausea.  She is still  able to take p.o.  While in the emergency department, she received 1 L of LR, fentanyl for pain, Rocephin for PID, Toradol for pain, and the first doses of her metronidazole and doxycycline.  At this time, the patient is able to eat and drink without emesis.  She reports her pain is better under control.  She is nonseptic appearing.  At this time safe for discharge.  Gave first doses of her antibiotics in the emergency department.  Recommended she continue using Azo although I think her dysuria is from her pelvic inflammatory disease.  Stressed the importance of adhering to this antibiotic regimen and completing it to the entirety of the course.  Recommended she call her gynecologist office in the morning to schedule an appointment as soon as possible for reevaluation and follow-up.  Discharging home on Percocet, PDMP checked.  Also sending doxycycline, metronidazole, and small course of Zofran home with the patient.  Strict return precautions were discussed with her.  Patient agrees with plan.  Patient is stable being discharged home in good condition.  I discussed this case with my attending physician who cosigned this note including patient's presenting symptoms, physical exam, and planned diagnostics and interventions. Attending physician stated agreement with plan or made changes to plan which were implemented.   Final Clinical Impression(s) / ED Diagnoses Final diagnoses:  PID (acute pelvic inflammatory disease)    Rx / DC Orders ED Discharge Orders          Ordered    doxycycline (VIBRAMYCIN) 100 MG capsule  2 times daily        10/25/21 2115    metroNIDAZOLE (FLAGYL) 500 MG tablet  2 times daily        10/25/21 2115    oxyCODONE-acetaminophen (PERCOCET/ROXICET) 5-325 MG tablet  Every 6 hours PRN  10/25/21 2115    ondansetron (ZOFRAN) 4 MG tablet  Every 6 hours        10/25/21 2117              Achille Rich, [place] 10/27/21 1306    Mancel Bale, MD 10/30/21  1330

## 2021-10-25 NOTE — ED Triage Notes (Signed)
Pt presents with c/o pain with urination. Pt reports she was diagnosed with a probably UTI yesterday at [facility] and given antibiotics but reports the pain has gotten worse.  ?

## 2023-01-16 ENCOUNTER — Ambulatory Visit: Payer: 59 | Admitting: Family Medicine

## 2023-01-16 ENCOUNTER — Encounter: Payer: Self-pay | Admitting: Family Medicine

## 2023-01-16 VITALS — BP 108/68 | HR 92 | Temp 99.0°F | Ht 65.0 in | Wt 114.4 lb

## 2023-01-16 DIAGNOSIS — R61 Generalized hyperhidrosis: Secondary | ICD-10-CM

## 2023-01-16 DIAGNOSIS — F411 Generalized anxiety disorder: Secondary | ICD-10-CM | POA: Diagnosis not present

## 2023-01-16 DIAGNOSIS — G8929 Other chronic pain: Secondary | ICD-10-CM | POA: Insufficient documentation

## 2023-01-16 DIAGNOSIS — R109 Unspecified abdominal pain: Secondary | ICD-10-CM | POA: Diagnosis not present

## 2023-01-16 MED ORDER — DRYSOL 20 % EX SOLN: Freq: Every day | CUTANEOUS | 0 refills | Status: DC

## 2023-01-16 MED ORDER — AMITRIPTYLINE HCL 10 MG PO TABS: 10.0000 mg | ORAL_TABLET | Freq: Every day | ORAL | 0 refills | Status: DC

## 2023-01-16 NOTE — Patient Instructions (Addendum)
Aim to do some physical exertion for 150 minutes per week. This is typically divided into 5 days per week, 30 minutes per day. The activity should be enough to get your heart rate up. Anything is better than nothing if you have time constraints.  Consider taking Metamucil or Benefiber daily.  Coping skills Choose 5 that work for you: Take a deep breath Count to 20 Read a book Do a puzzle Meditate Bake Sing Knit Garden Pray Go outside Call a friend Listen to music Take a walk Color Send a note Take a bath Watch a movie Be alone in a quiet place Pet an animal Visit a friend Journal Exercise Stretch   Please consider counseling. Contact [phone] to schedule an appointment or inquire about cost/insurance coverage.  [facility] located at [address], [address], [place], [place].  Phone number = [phone].  Dr. Regan Lemming - Adult Psychiatry.    [facility] located at [address] [place], [place], [place]. Phone number = [phone].   [facility] located at [address] [place], [place].  Phone number = [phone].   [facility] located at [address] [place], [place], [place].  Phone number = [phone].  Let us know if you need anything.

## 2023-01-16 NOTE — Progress Notes (Signed)
Chief Complaint  Patient presents with   New Patient (Initial Visit)   Anxiety    GI problems       New Patient Visit SUBJECTIVE: HPI: Erica Guzman is an 27 y.o.female who is being seen for establishing care.  The patient has not had PCP in quite some time.   4.5 mo ago, her grandmother passed away. They were close and she started having anxiety and GI s/s's such as N/V, abd pain, decreased appetite, loose stools and freq. She had racing thoughts and difficulty concentrating as well. Irrational fear and more stool frequency at work began 2 mo ago when she started splitting custody of her 65 yo son. Hx of GAD but got better with regulating her hormones. Had been on Lexapro and Prozac while in HS and did not notice an improvement on them. Saw a counselor in HS, none recently. No plans to have children in the near future. She has associated migraines and increased sweating since this began as well. Has only been using Pepto Bismol at home to help, which has had limited efficacy.   Past Medical History:  Diagnosis Date   GAD (generalized anxiety disorder)    Past Surgical History:  Procedure Laterality Date   TONSILLECTOMY  Age 20   Family History  Problem Relation Age of Onset   Bipolar disorder Mother    Diabetes Father    Lung cancer Maternal Grandmother        smoker   Dementia Maternal Grandfather    Heart disease Maternal Grandfather    Diabetes Maternal Grandfather    Breast cancer Neg Hx    Cervical cancer Neg Hx    Allergies  Allergen Reactions   Penicillins Rash    Current Outpatient Medications:    aluminum chloride (DRYSOL) 20 % external solution, Apply topically at bedtime., Disp: 60 mL, Rfl: 0   amitriptyline (ELAVIL) 10 MG tablet, Take 1 tablet (10 mg total) by mouth at bedtime., Disp: 30 tablet, Rfl: 0  OBJECTIVE: BP 108/68 (BP Location: Left Arm, Patient Position: Sitting, Cuff Size: Normal)   Pulse 92   Temp 99 F (37.2 C) (Oral)   Ht 5\' 5"  (1.651 m)   Wt  114 lb 6 oz (51.9 kg)   SpO2 99%   BMI 19.03 kg/m  General:  well developed, well nourished, in no apparent distress Skin:  no significant moles, warts, or growths Throat/Pharynx:  lips and gingiva without lesion; tongue and uvula midline; non-inflamed pharynx; no exudates or postnasal drainage Lungs:  clear to auscultation, breath sounds equal bilaterally, no respiratory distress Cardio:  regular rate and rhythm, no LE edema or bruits Abd: BS+, S, NT, ND Neuro:  gait normal Psych: well oriented with normal range of affect and appropriate judgment/insight  ASSESSMENT/PLAN: GAD (generalized anxiety disorder) - Plan: amitriptyline (ELAVIL) 10 MG tablet  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 1/2. Chronic, unstable. Add Elavil 10 mg qhs. Anxiety coping techniques and counseling info provided in AVS. Counseled on exercise. 3. Topical Drysol qhs.  Patient should return 1 mo to reck. The patient voiced understanding and agreement to the plan.   Jilda Roche Oto, DO 01/16/23  5:05 PM

## 2023-02-17 ENCOUNTER — Other Ambulatory Visit: Payer: Self-pay | Admitting: Family Medicine

## 2023-02-17 DIAGNOSIS — F411 Generalized anxiety disorder: Secondary | ICD-10-CM

## 2023-03-20 ENCOUNTER — Other Ambulatory Visit: Payer: Self-pay | Admitting: Family Medicine

## 2023-03-20 DIAGNOSIS — F411 Generalized anxiety disorder: Secondary | ICD-10-CM

## 2023-03-21 ENCOUNTER — Encounter: Payer: Self-pay | Admitting: Family Medicine

## 2023-03-21 ENCOUNTER — Ambulatory Visit: Payer: 59 | Admitting: Family Medicine

## 2023-03-21 VITALS — BP 110/62 | HR 89 | Temp 99.0°F | Ht 64.0 in | Wt 115.5 lb

## 2023-03-21 DIAGNOSIS — F411 Generalized anxiety disorder: Secondary | ICD-10-CM

## 2023-03-21 DIAGNOSIS — R519 Headache, unspecified: Secondary | ICD-10-CM

## 2023-03-21 DIAGNOSIS — F410 Panic disorder [episodic paroxysmal anxiety] without agoraphobia: Secondary | ICD-10-CM

## 2023-03-21 MED ORDER — KETOROLAC TROMETHAMINE 60 MG/2ML IM SOLN
60.0000 mg | Freq: Once | INTRAMUSCULAR | Status: AC
  Administered 2023-03-21: 60 mg via INTRAMUSCULAR

## 2023-03-21 MED ORDER — VENLAFAXINE HCL ER 37.5 MG PO CP24: 37.5000 mg | ORAL_CAPSULE | Freq: Every day | ORAL | 1 refills | Status: DC

## 2023-03-21 MED ORDER — PROPRANOLOL HCL 10 MG PO TABS: ORAL_TABLET | ORAL | 1 refills | Status: DC

## 2023-03-21 NOTE — Progress Notes (Signed)
Chief Complaint  Patient presents with   Follow-up   Headache    BP elevated at work    Subjective Erica Guzman presents for f/u anxiety.  Pt is currently being treated with Elavil 10 mg/d.  Reports 30-40% improvement since treatment. No thoughts of harming self or others. No self-medication with alcohol, prescription drugs or illicit drugs. Pt is not following with a counselor/psychologist.  Frontal HA for 2 weeks.  It does affect her vision.  This is historically how her headaches do arise.  She has not from the moment she wakes up to when she goes to bed.  This is affecting her anxiety as well.  Overall, she feels her headaches are better since starting Elavil.  No neurologic signs or symptoms.  Past Medical History:  Diagnosis Date   GAD (generalized anxiety disorder)    Allergies as of 03/21/2023       Reactions   Penicillins Rash        Medication List        Accurate as of March 21, 2023 11:55 AM. If you have any questions, ask your nurse or doctor.          amitriptyline 10 MG tablet Commonly known as: ELAVIL TAKE 1 TABLET BY MOUTH AT BEDTIME   Drysol 20 % external solution Generic drug: aluminum chloride Apply topically at bedtime.   propranolol 10 MG tablet Commonly known as: INDERAL Take 1 tab 3 times daily as needed for panic attacks. Started by: Jilda Roche Marilee Ditommaso   venlafaxine XR 37.5 MG 24 hr capsule Commonly known as: Effexor XR Take 1 capsule (37.5 mg total) by mouth daily with breakfast. Started by: Sharlene Dory        Exam BP 110/62 (BP Location: Left Arm, Patient Position: Sitting, Cuff Size: Normal)   Pulse 89   Temp 99 F (37.2 C) (Oral)   Ht 5\' 4"  (1.626 m)   Wt 115 lb 8 oz (52.4 kg)   SpO2 99%   BMI 19.83 kg/m  General:  well developed, well nourished, in no apparent distress MSK: Mild hypertonicity and TTP over the trapezius musculature bilaterally.  No TTP over the frontal bone, temporalis, or TMJ  bilaterally Lungs:  No respiratory distress Neuro: 5/5 strength throughout, gait is normal, no cerebellar signs.  DTRs equal and symmetric throughout, no clonus. Psych: well oriented with normal range of affect and age-appropriate judgement/insight, alert and oriented x4.  Assessment and Plan  GAD (generalized anxiety disorder) - Plan: venlafaxine XR (EFFEXOR XR) 37.5 MG 24 hr capsule  Panic attack - Plan: propranolol (INDERAL) 10 MG tablet  Headache, unspecified headache type - Plan: ketorolac (TORADOL) injection 60 mg  1/2.  Chronic, uncontrolled.  Start Effexor 37.5 mg daily, propranolol 10 mg 3 times daily as needed.  Consider seeing a counselor.  Counseled on exercise.  Continue amitriptyline 10 mg daily as this is helping with her anxiety to some extent but quite helpful with her abdominal symptoms. 3.  I believe the above treatment will help with future headaches.  Toradol injection today. F/u in 1 mo to reck 1/2. The patient voiced understanding and agreement to the plan.  Jilda Roche [place], DO 03/21/23 11:55 AM

## 2023-03-21 NOTE — Patient Instructions (Signed)
Stay active.   Let us know if there are issues.

## 2023-03-22 ENCOUNTER — Other Ambulatory Visit: Payer: Self-pay

## 2023-03-22 ENCOUNTER — Other Ambulatory Visit: Payer: Self-pay | Admitting: Family Medicine

## 2023-03-22 ENCOUNTER — Encounter (HOSPITAL_COMMUNITY): Payer: Self-pay

## 2023-03-22 ENCOUNTER — Encounter: Payer: Self-pay | Admitting: Family Medicine

## 2023-03-22 ENCOUNTER — Emergency Department (HOSPITAL_COMMUNITY): Admission: EM | Admit: 2023-03-22 | Payer: 59 | Source: Home / Self Care

## 2023-03-22 DIAGNOSIS — G43909 Migraine, unspecified, not intractable, without status migrainosus: Secondary | ICD-10-CM | POA: Diagnosis present

## 2023-03-22 DIAGNOSIS — Z5321 Procedure and treatment not carried out due to patient leaving prior to being seen by health care provider: Secondary | ICD-10-CM | POA: Diagnosis not present

## 2023-03-22 LAB — CBC WITH DIFFERENTIAL/PLATELET
Abs Immature Granulocytes: 0.03 10*3/uL (ref 0.00–0.07)
Basophils Absolute: 0.1 10*3/uL (ref 0.0–0.1)
Basophils Relative: 1 %
Eosinophils Absolute: 0.3 10*3/uL (ref 0.0–0.5)
Eosinophils Relative: 3 %
HCT: 43.1 % (ref 36.0–46.0)
Hemoglobin: 14.8 g/dL (ref 12.0–15.0)
Immature Granulocytes: 0 %
Lymphocytes Relative: 38 %
Lymphs Abs: 4 10*3/uL (ref 0.7–4.0)
MCH: 31.4 pg (ref 26.0–34.0)
MCHC: 34.3 g/dL (ref 30.0–36.0)
MCV: 91.5 fL (ref 80.0–100.0)
Monocytes Absolute: 0.6 10*3/uL (ref 0.1–1.0)
Monocytes Relative: 5 %
Neutro Abs: 5.6 10*3/uL (ref 1.7–7.7)
Neutrophils Relative %: 53 %
Platelets: 325 10*3/uL (ref 150–400)
RBC: 4.71 MIL/uL (ref 3.87–5.11)
RDW: 11.5 % (ref 11.5–15.5)
WBC: 10.5 10*3/uL (ref 4.0–10.5)
nRBC: 0 % (ref 0.0–0.2)

## 2023-03-22 LAB — BASIC METABOLIC PANEL
Anion gap: 9 (ref 5–15)
BUN: 12 mg/dL (ref 6–20)
CO2: 23 mmol/L (ref 22–32)
Calcium: 9.3 mg/dL (ref 8.9–10.3)
Chloride: 103 mmol/L (ref 98–111)
Creatinine, Ser: 0.81 mg/dL (ref 0.44–1.00)
GFR, Estimated: >60 mL/min (ref >60)
Glucose, Bld: 91 mg/dL (ref 70–99)
Potassium: 3.9 mmol/L (ref 3.5–5.1)
Sodium: 135 mmol/L (ref 135–145)

## 2023-03-22 MED ORDER — RIZATRIPTAN BENZOATE 10 MG PO TABS
10.0000 mg | ORAL_TABLET | ORAL | 0 refills | Status: DC | PRN
Start: 2023-03-22 — End: 2023-11-26

## 2023-03-22 NOTE — ED Triage Notes (Signed)
Pt has been having a migraine for the last 2 weeks. Has been trying the over the counter medications that is not helping. Went to her pcp yesterday who gave her a toradol shot and she felt like it made it worse.

## 2023-03-23 NOTE — ED Notes (Signed)
Called multiple times for pt in lobby. Pt not present in ER, and eloped.

## 2023-03-25 ENCOUNTER — Telehealth: Payer: Self-pay

## 2023-03-25 NOTE — Telephone Encounter (Signed)
Called left message to call back 

## 2023-03-25 NOTE — Telephone Encounter (Signed)
Pt returned call. Asked if she was able to receive her medication and pt said yes she did and she ended up going to the ER. Pt scheduled follow up this Friday but was advised to call back first thing in the morning to see if there have been any cancellations that may work better for her schedule. Pt understands.

## 2023-03-25 NOTE — Telephone Encounter (Signed)
Pt evaluated/treated in ER.   [facility] Night - Client TELEPHONE ADVICE RECORD AccessNurse Patient Name First: ALEXISLast: Guzman Initial Comment Remade Chart [id number]. Caller states she has had a migraine for 2 weeks that is causing blurred vision. She received a shot in office and was supposed to receive an Rx, but the pharmacy doesn't have it. Translation No Nurse Assessment Nurse: Carlos Levering, RN, Tobi Bastos Date/Time (Eastern Time): 03/22/2023 7:10:46 PM Please select the assessment type ---RX called in but not at pharm Additional Documentation ---prescribed as needed medication for migraine, not at pharmacy Document the name of the medication. ---unknown Pharmacy name and phone number. ---[facility] [address] [phone] Has the office closed within the last 30 minutes? ---No Does the client directives allow for assistance with medications after hours? ---No Nurse: Carlos Levering, RN, Tobi Bastos Date/Time (Eastern Time): 03/22/2023 7:09:14 PM Confirm and document reason for call. If symptomatic, describe symptoms. ---Caller states she has had a migraine for 2 weeks now with intermittent blurred vision. She had an appt yesterday, received a toradol shot which didn't help. Blurred vision returned at 6pm last night. HA is worsening, was 5-6/10, now 8/10. Does the patient have any new or worsening symptoms? ---Yes Will a triage be completed? ---Yes Related visit to physician within the last 2 weeks? ---Yes Does the PT have any chronic conditions? (i.e. diabetes, asthma, this includes High risk factors for pregnancy, etc.) ---Yes PLEASE NOTE: All timestamps contained within this report are represented as [place] Standard Time. CONFIDENTIALTY NOTICE: This fax transmission is intended only for the addressee. It contains information that is legally privileged, confidential or otherwise protected from use or disclosure. If you are not the intended recipient, you are  strictly prohibited from reviewing, disclosing, copying using or disseminating any of this information or taking any action in reliance on or regarding this information. If you have received this fax in error, please notify us immediately by telephone so that we can arrange for its return to [place]. Phone: [phone], Toll-Free: [phone], Fax: [phone] Page: 2 of 3 Call Id: [id number] Nurse Assessment List chronic conditions. ---migraines Is the patient pregnant or possibly pregnant? (Ask all females between the ages of 4-55) ---No Is this a behavioral health or substance abuse call? ---No Guidelines Guideline Title Affirmed Question Affirmed Notes Nurse Date/Time Lamount Cohen Time) Recent Medical Visit for Illness Follow-up Call [1] SEVERE pain (e.g., excruciating, pain scale 8-10) AND [2] not improved after pain medications Carlos Levering, RN, Tobi Bastos 03/22/2023 7:12:17 PM Disp. Time Lamount Cohen Time) Disposition Final User 03/22/2023 6:46:06 PM Send To RN Personal Schaldach, Rachael 03/22/2023 7:15:10 PM Call PCP Now Yes Carlos Levering, RN, Tobi Bastos 03/22/2023 7:23:30 PM Called On-Call Provider Quandt, RN, Tobi Bastos Final Disposition 03/22/2023 7:15:10 PM Call PCP Now Yes Carlos Levering, RN, Carrolyn Leigh Disagree/Comply Comply Caller Understands Yes PreDisposition Did not know what to do Care Advice Given Per Guideline CALL PCP NOW: * I'll page the on-call provider now. If you haven't heard from the provider (or me) within 30 minutes, call again. CALL BACK IF: * You become worse CARE ADVICE given per Recent Medical Visit for Illness: Follow-Up Call (Adult) guideline. Comments User: Karn Pickler, RN Date/Time Lamount Cohen Time): 03/22/2023 7:13:10 PM reports new onset weakness User: Karn Pickler, RN Date/Time Lamount Cohen Time): 03/22/2023 7:15:48 PM still able to do normal activites, just reports generalized weakness/feeling run down User: Karn Pickler, RN Date/Time (Eastern Time): 03/22/2023 7:16:26 PM has had migraines in the past,  but nothing that needed medical intervention. PLEASE  NOTE: All timestamps contained within this report are represented as [place] Standard Time. CONFIDENTIALTY NOTICE: This fax transmission is intended only for the addressee. It contains information that is legally privileged, confidential or otherwise protected from use or disclosure. If you are not the intended recipient, you are strictly prohibited from reviewing, disclosing, copying using or disseminating any of this information or taking any action in reliance on or regarding this information. If you have received this fax in error, please notify us immediately by telephone so that we can arrange for its return to [place]. Phone: [phone], Toll-Free: [phone], Fax: [phone] Page: 3 of 3 Call Id: [id number] Comments User: Karn Pickler, RN Date/Time Lamount Cohen Time): 03/22/2023 7:24:00 PM called caller back to relay ocp's recommendation, caller verbalized understanding Paging [facility] Phone DateTime Result/ Outcome Message Type Notes Felix Pacini [phone] 03/22/2023 7:23:30 PM Called On Call Provider - Reached Doctor Paged Felix Pacini 03/22/2023 7:23:46 PM Spoke with On Call - Physician Upgrade Message Result ocp recommended ed for imaging d/t worsening symptom

## 2023-03-29 ENCOUNTER — Ambulatory Visit: Payer: 59 | Admitting: Family Medicine

## 2023-04-16 ENCOUNTER — Other Ambulatory Visit: Payer: Self-pay | Admitting: Family Medicine

## 2023-04-16 DIAGNOSIS — F411 Generalized anxiety disorder: Secondary | ICD-10-CM

## 2023-06-01 ENCOUNTER — Other Ambulatory Visit: Payer: Self-pay | Admitting: Family Medicine

## 2023-06-01 DIAGNOSIS — F411 Generalized anxiety disorder: Secondary | ICD-10-CM

## 2023-07-19 ENCOUNTER — Other Ambulatory Visit: Payer: Self-pay | Admitting: Family Medicine

## 2023-07-19 DIAGNOSIS — F411 Generalized anxiety disorder: Secondary | ICD-10-CM

## 2023-07-22 IMAGING — CT CT RENAL STONE PROTOCOL
2 of 4 series · 16 of 46 positions shown, 18 images · non-contrast
Comparison: None.

CLINICAL DATA: Pain with urination, UTI, flank pain.



[Series 2: axial st · axial · 0.66mm/px · z∈[-402,-62]mm · 13 of 78 slices shown, 15 images]
[im 5/78  soft-tissue]
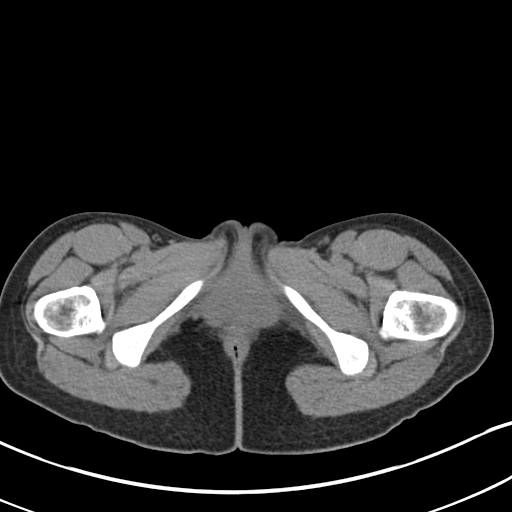
[im 5/78  bone]
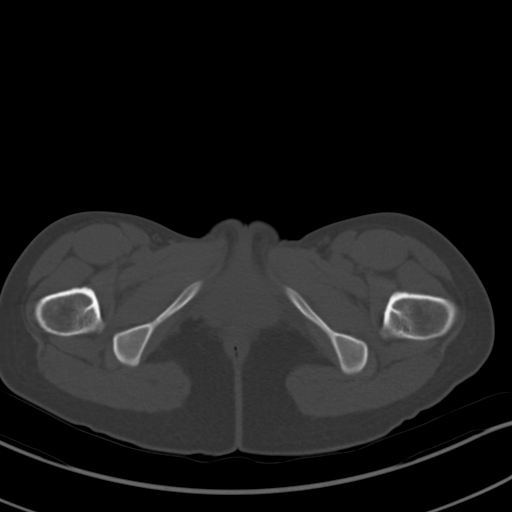
[im 10/78  soft-tissue]
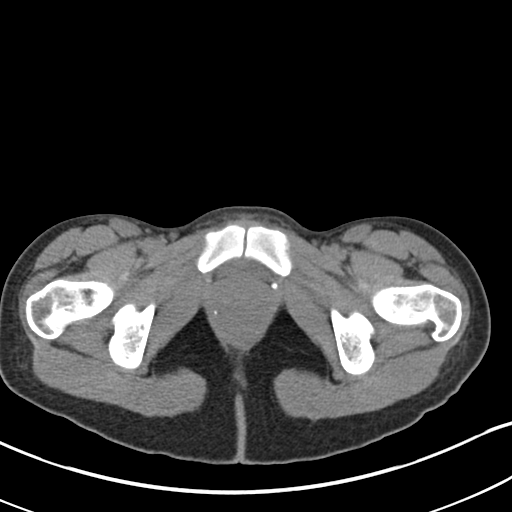
[im 19/78  soft-tissue]
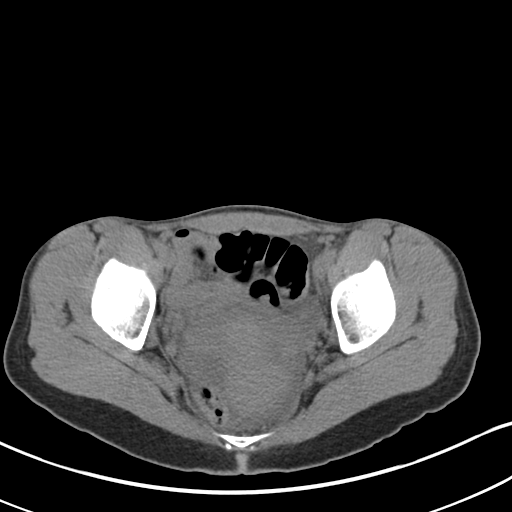
[im 23/78  soft-tissue]
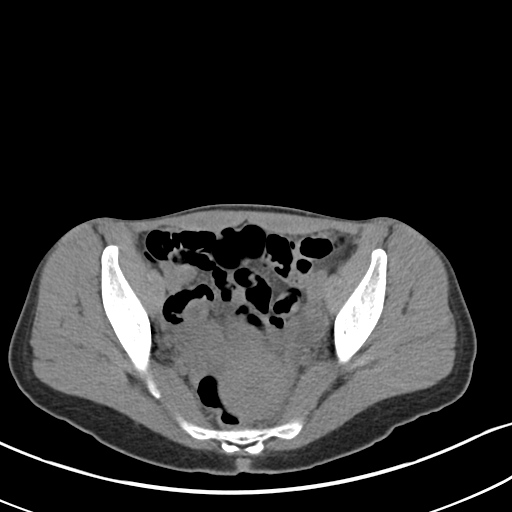
[im 28/78  soft-tissue]
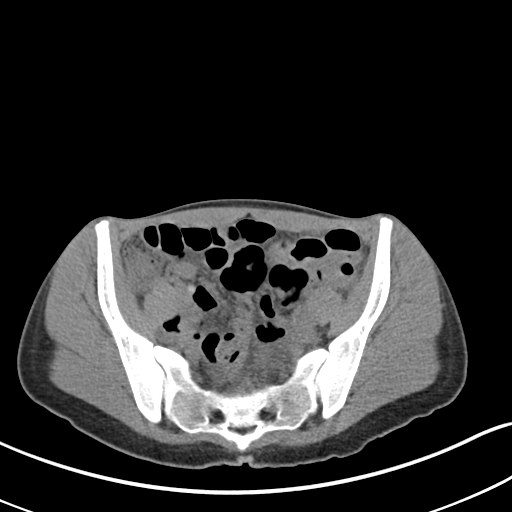
[im 32/78  soft-tissue]
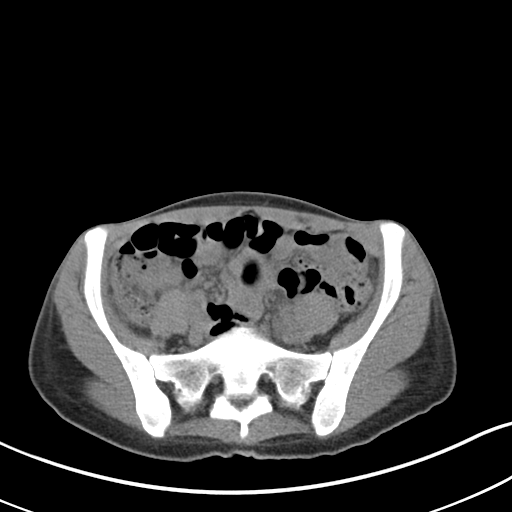
[im 41/78  soft-tissue]
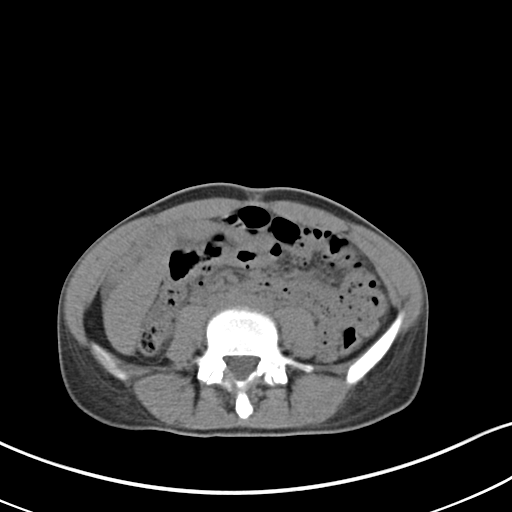
[im 46/78  soft-tissue]
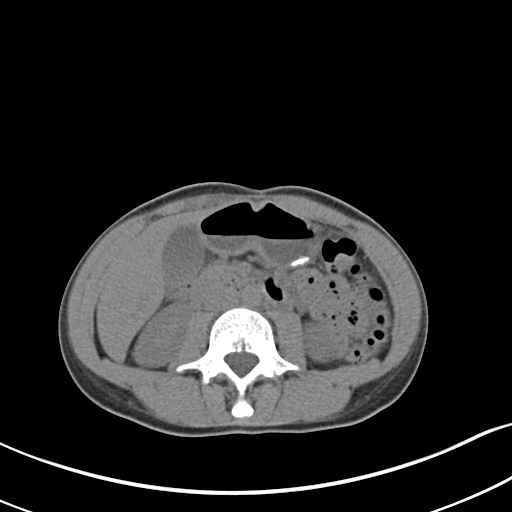
[im 50/78  soft-tissue]
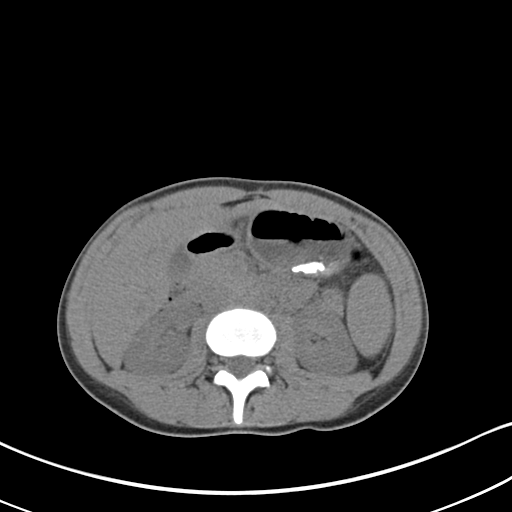
[im 50/78  bone]
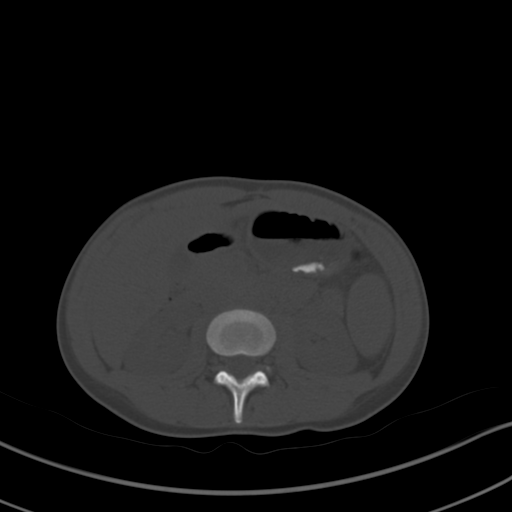
[im 55/78  soft-tissue]
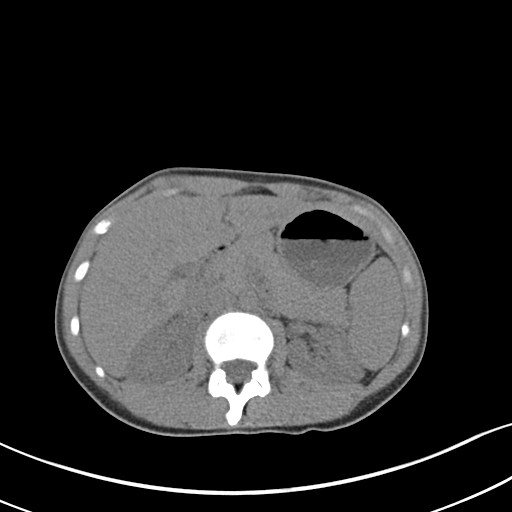
[im 59/78  soft-tissue]
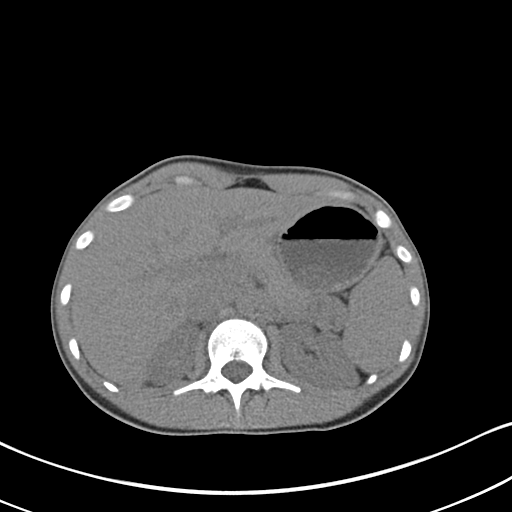
[im 68/78  soft-tissue]
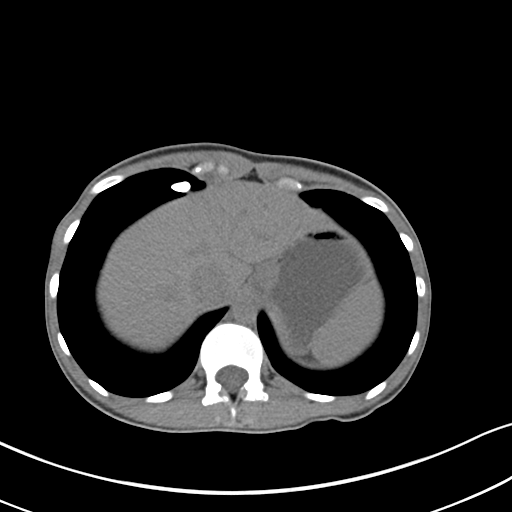
[im 73/78  soft-tissue]
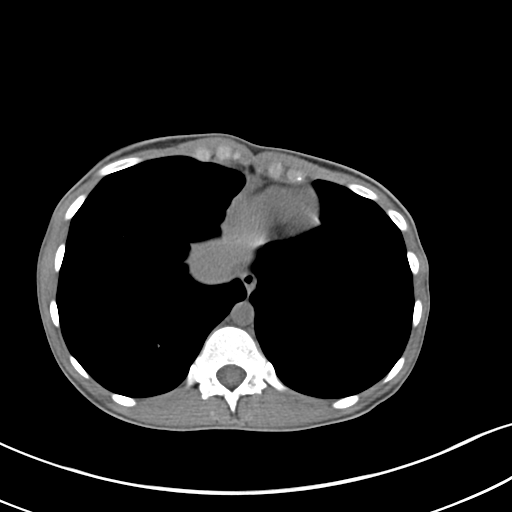

[Series 5: coronal · coronal · 0.70mm/px · 3 of 107 slices shown]
[im 36/107  soft-tissue]
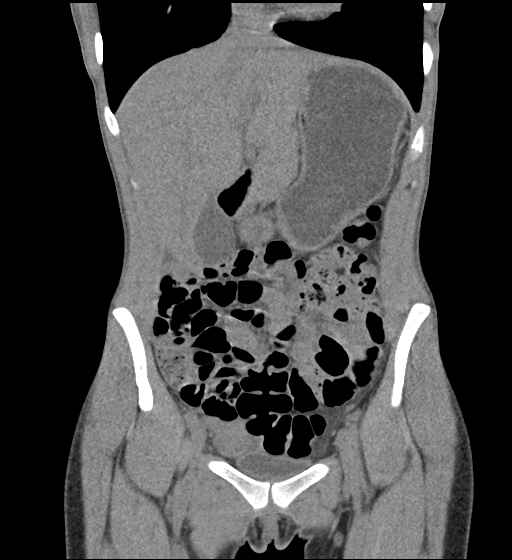
[im 48/107  soft-tissue]
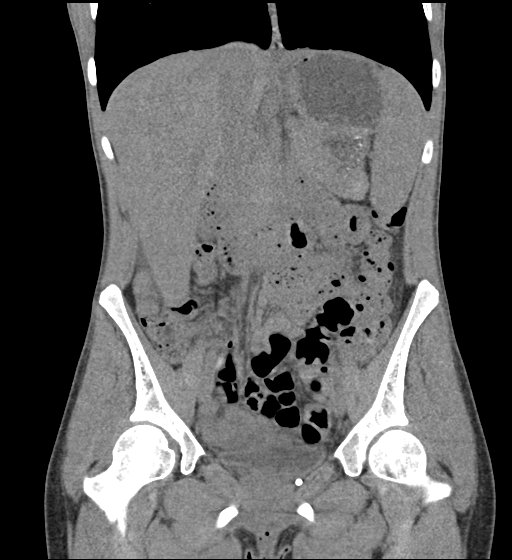
[im 59/107  soft-tissue]
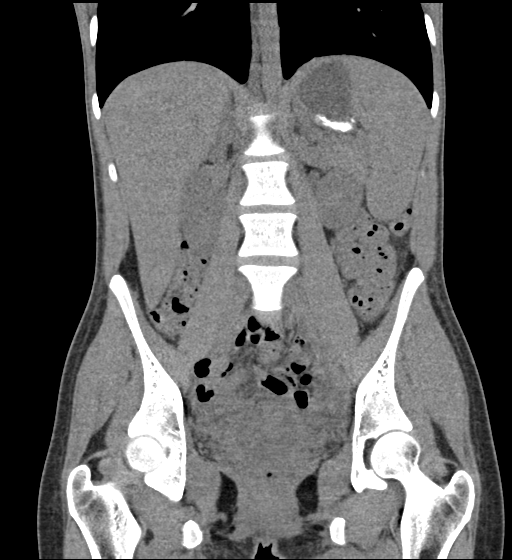

[16 of 46 positions shown; findings below may reference images not displayed]

FINDINGS: Lower chest: Calcified granuloma in the right middle lobe. Heart
size normal. No pericardial or pleural effusion.

Hepatobiliary: Liver and gallbladder are unremarkable. No biliary
ductal dilatation.

Pancreas: Negative.

Spleen: Negative.

Adrenals/Urinary Tract: Adrenal glands and kidneys are unremarkable.
No urinary stones. Ureters are decompressed. Bladder is low in
volume.

Stomach/Bowel: Stomach, small bowel, appendix and colon are
unremarkable.

Vascular/Lymphatic: Vascular structures are unremarkable. No
pathologically enlarged lymph nodes.

Reproductive: Uterus is visualized. A well-circumscribed rounded
structure measuring 4.2 cm in diameter is seen in the region of the
cervix/vagina and may represent a contraceptive device. No adnexal
mass.

Other: Small pelvic free fluid. No definite adnexal mass. Small
perihepatic ascites.

Musculoskeletal: None.
IMPRESSION: 1. No urinary stones or obstruction.
2. Well-circumscribed rounded fullness in the cervical region may
represent a contraceptive device. If the patient does not report an
indwelling device such as a cervical cap, pelvic ultrasound is
recommended in further evaluation.
3. Small pelvic free fluid with tiny right perihepatic ascites.
Pelvic ultrasound may be helpful in further evaluation of the
ovaries, as clinically indicated.

## 2023-08-26 ENCOUNTER — Other Ambulatory Visit: Payer: Self-pay | Admitting: Family Medicine

## 2023-08-26 DIAGNOSIS — F411 Generalized anxiety disorder: Secondary | ICD-10-CM

## 2023-10-02 ENCOUNTER — Other Ambulatory Visit: Payer: Self-pay | Admitting: Family Medicine

## 2023-10-02 DIAGNOSIS — F411 Generalized anxiety disorder: Secondary | ICD-10-CM

## 2023-11-14 ENCOUNTER — Other Ambulatory Visit: Payer: Self-pay | Admitting: Family Medicine

## 2023-11-14 DIAGNOSIS — F411 Generalized anxiety disorder: Secondary | ICD-10-CM

## 2023-11-26 ENCOUNTER — Other Ambulatory Visit: Payer: Self-pay | Admitting: Family Medicine

## 2023-11-26 ENCOUNTER — Ambulatory Visit: Admitting: Family Medicine

## 2023-11-26 ENCOUNTER — Other Ambulatory Visit: Payer: Self-pay

## 2023-11-26 ENCOUNTER — Encounter: Payer: Self-pay | Admitting: Family Medicine

## 2023-11-26 VITALS — BP 116/87 | HR 92 | Temp 97.7°F | Resp 16 | Ht 64.0 in | Wt 125.0 lb

## 2023-11-26 DIAGNOSIS — F411 Generalized anxiety disorder: Secondary | ICD-10-CM | POA: Diagnosis not present

## 2023-11-26 DIAGNOSIS — R5383 Other fatigue: Secondary | ICD-10-CM

## 2023-11-26 DIAGNOSIS — R7989 Other specified abnormal findings of blood chemistry: Secondary | ICD-10-CM

## 2023-11-26 LAB — COMPREHENSIVE METABOLIC PANEL WITH GFR
ALT: 16 U/L (ref 0–35)
AST: 17 U/L (ref 0–37)
Albumin: 4.7 g/dL (ref 3.5–5.2)
Alkaline Phosphatase: 74 U/L (ref 39–117)
BUN: 11 mg/dL (ref 6–23)
CO2: 25 meq/L (ref 19–32)
Calcium: 9.5 mg/dL (ref 8.4–10.5)
Chloride: 105 meq/L (ref 96–112)
Creatinine, Ser: 0.76 mg/dL (ref 0.40–1.20)
GFR: 107.06 mL/min (ref >60.00)
Glucose, Bld: 87 mg/dL (ref 70–99)
Potassium: 4.2 meq/L (ref 3.5–5.1)
Sodium: 140 meq/L (ref 135–145)
Total Bilirubin: 1 mg/dL (ref 0.2–1.2)
Total Protein: 7 g/dL (ref 6.0–8.3)

## 2023-11-26 LAB — CBC
HCT: 44.4 % (ref 36.0–46.0)
Hemoglobin: 14.8 g/dL (ref 12.0–15.0)
MCHC: 33.4 g/dL (ref 30.0–36.0)
MCV: 92.8 fl (ref 78.0–100.0)
Platelets: 298 10*3/uL (ref 150.0–400.0)
RBC: 4.79 Mil/uL (ref 3.87–5.11)
RDW: 13.9 % (ref 11.5–15.5)
WBC: 6.8 10*3/uL (ref 4.0–10.5)

## 2023-11-26 LAB — VITAMIN B12: Vitamin B-12: 281 pg/mL (ref 211–911)

## 2023-11-26 LAB — TSH: TSH: 2.01 u[IU]/mL (ref 0.35–5.50)

## 2023-11-26 LAB — VITAMIN D 25 HYDROXY (VIT D DEFICIENCY, FRACTURES): VITD: 19.94 ng/mL — ABNORMAL LOW (ref 30.00–100.00)

## 2023-11-26 LAB — T4, FREE: Free T4: 0.89 ng/dL (ref 0.60–1.60)

## 2023-11-26 MED ORDER — HYDROXYZINE HCL 10 MG PO TABS
10.0000 mg | ORAL_TABLET | Freq: Three times a day (TID) | ORAL | 0 refills | Status: DC | PRN
Start: 2023-11-26 — End: 2023-12-04

## 2023-11-26 MED ORDER — VITAMIN D (ERGOCALCIFEROL) 1.25 MG (50000 UNIT) PO CAPS: 50000.0000 [IU] | ORAL_CAPSULE | ORAL | 0 refills | Status: DC

## 2023-11-26 MED ORDER — AMITRIPTYLINE HCL 25 MG PO TABS: 25.0000 mg | ORAL_TABLET | Freq: Every day | ORAL | 1 refills | Status: DC

## 2023-11-26 NOTE — Patient Instructions (Addendum)
 Give [place] 2-3 business days to get the results of your labs back.   Try to drink 55-60 oz of water daily outside of exercise.  Stay active.  Send me a message in 2 weeks with how things are going.   Please consider counseling. Contact [phone] to schedule an appointment or inquire about cost/insurance coverage.  [facility] located at [address], [place], [place].  Phone number = [phone].  Dr. Regan Lemming - Adult Psychiatry.    [facility] located at [address] [place], [place], [place]. Phone number = [phone].   [facility] located at [address], [place], [place].  Phone number = [phone].   [facility] located at [address] [place], [place], [place].  Phone number = [phone].  Let us know if you need anything.

## 2023-11-26 NOTE — Progress Notes (Signed)
 Chief Complaint  Patient presents with   Fatigue    Patient complains of fatigue for the past 6 weeks    Subjective: Patient is a 28 y.o. female here for f/u.  Over the past month, the patient has been experiencing more fatigue.  She is eating and drinking normally.  Sleep has been poor.  She usually needs around 8 hours to feel well rested.  She has been getting interrupted sleep.  Racing thoughts seem to be providing this.  Anxiety has gotten worse.  She is only taking Elavil 10 mg nightly.  It used to help her sleep but is not anymore.  She has not seen a therapist or counselor.  She is on the Depo shot limits the heavy flow of her cycles.  No nausea, vomiting, diarrhea.  Past Medical History:  Diagnosis Date   GAD (generalized anxiety disorder)     Objective: BP 116/87 (BP Location: Right Arm, Patient Position: Sitting, Cuff Size: Small)   Pulse 92   Temp 97.7 F (36.5 C) (Oral)   Resp 16   Ht 5\' 4"  (1.626 m)   Wt 125 lb (56.7 kg)   SpO2 99%   BMI 21.46 kg/m  General: Awake, appears stated age Heart: RRR, no LE edema Lungs: CTAB, no rales, wheezes or rhonchi. No accessory muscle use Psych: Age appropriate judgment and insight, normal affect and mood  Assessment and Plan: GAD (generalized anxiety disorder) - Plan: hydrOXYzine (ATARAX) 10 MG tablet, amitriptyline (ELAVIL) 25 MG tablet  Fatigue, unspecified type - Plan: CBC, Comprehensive metabolic panel with GFR, T4, free, TSH, VITAMIN D 25 Hydroxy (Vit-D Deficiency, Fractures), B12  Chronic, not controlled.  Add Atarax 10-20 mg 3 times daily as needed.  She will let me know if there are any issues.  Increase amitriptyline from 10 mg nightly to 25 mg nightly.  Counseling information provided.  Stay active.  Send me a message in 2 weeks to let me know how things are going.  I will see her in 6 weeks. Check labs as above.  Stay hydrated. The patient voiced understanding and agreement to the plan.  Jilda Roche [place],  DO 11/26/23  8:15 AM

## 2023-12-04 ENCOUNTER — Other Ambulatory Visit: Payer: Self-pay | Admitting: Family Medicine

## 2023-12-04 MED ORDER — HYDROXYZINE PAMOATE 25 MG PO CAPS: 25.0000 mg | ORAL_CAPSULE | Freq: Three times a day (TID) | ORAL | 0 refills | Status: DC | PRN

## 2024-01-07 ENCOUNTER — Encounter: Payer: Self-pay | Admitting: Family Medicine

## 2024-01-07 ENCOUNTER — Ambulatory Visit (INDEPENDENT_AMBULATORY_CARE_PROVIDER_SITE_OTHER): Admitting: Family Medicine

## 2024-01-07 VITALS — BP 112/76 | HR 101 | Temp 98.0°F | Resp 16 | Ht 64.0 in | Wt 125.6 lb

## 2024-01-07 DIAGNOSIS — F411 Generalized anxiety disorder: Secondary | ICD-10-CM

## 2024-01-07 MED ORDER — AMITRIPTYLINE HCL 25 MG PO TABS: 25.0000 mg | ORAL_TABLET | Freq: Every day | ORAL | 1 refills | Status: DC

## 2024-01-07 MED ORDER — CLONAZEPAM 0.5 MG PO TABS: 0.5000 mg | ORAL_TABLET | Freq: Two times a day (BID) | ORAL | 1 refills | Status: DC | PRN

## 2024-01-07 NOTE — Patient Instructions (Addendum)
 Please do not get pregnant on these medications.   Stay active.   Let us  know if you need anything.

## 2024-01-07 NOTE — Progress Notes (Signed)
 Chief Complaint  Patient presents with   Follow-up    Follow Up     Subjective Erica Guzman presents for f/u anxiety.  Pt is currently being treated with amitriptyline  25 mg nightly which seems to be helpful.  She is also on Atarax  25 mg daily as needed which has neither been helpful but also giving her headaches.  Her stress levels have gone up. No thoughts of harming self or others. No self-medication with alcohol, prescription drugs or illicit drugs. Pt is not currently following with a counselor/psychologist.  Past Medical History:  Diagnosis Date   GAD (generalized anxiety disorder)    Allergies as of 01/07/2024       Reactions   Penicillins Rash        Medication List        Accurate as of Jan 07, 2024  7:50 AM. If you have any questions, ask your nurse or doctor.          STOP taking these medications    hydrOXYzine  25 MG capsule Commonly known as: VISTARIL  Stopped by: Shellie Dials Palma Buster       TAKE these medications    amitriptyline  25 MG tablet Commonly known as: ELAVIL  Take 1 tablet (25 mg total) by mouth at bedtime.   clonazePAM 0.5 MG tablet Commonly known as: KLONOPIN Take 1 tablet (0.5 mg total) by mouth 2 (two) times daily as needed for anxiety. Started by: Jobe Mulder   Vitamin D  (Ergocalciferol ) 1.25 MG (50000 UNIT) Caps capsule Commonly known as: DRISDOL  Take 1 capsule (50,000 Units total) by mouth every 7 (seven) days.        Exam BP 112/76 (BP Location: Left Arm, Patient Position: Sitting)   Pulse (!) 101   Temp 98 F (36.7 C) (Oral)   Resp 16   Ht 5\' 4"  (1.626 m)   Wt 125 lb 9.6 oz (57 kg)   SpO2 98%   BMI 21.56 kg/m  General:  well developed, well nourished, in no apparent distress Lungs:  No respiratory distress Psych: well oriented with normal range of affect and age-appropriate judgement/insight, alert and oriented x4.  Assessment and Plan  GAD (generalized anxiety disorder) - Plan: amitriptyline   (ELAVIL ) 25 MG tablet  Chronic, uncontrolled.  Continue amitriptyline  25 mg daily.  She is in the process of establishing with a counselor.  Counseled on exercise.  Stop Atarax -she has failed propranolol  as well.  Start Klonopin 0.25-0.5 mg twice daily as needed.  Warned about possible physical dependence.  Advised her that she not get pregnant on these medications. F/u in 7 weeks. The patient voiced understanding and agreement to the plan.  Shellie Dials [place], DO 01/07/24 7:50 AM

## 2024-02-18 ENCOUNTER — Other Ambulatory Visit

## 2024-02-27 ENCOUNTER — Ambulatory Visit: Payer: Self-pay

## 2024-02-27 NOTE — Telephone Encounter (Signed)
 FYI Only or Action Required?: Action required by provider: request for appointment.  Patient was last seen in primary care on 01/07/2024 by Frann Mabel Mt, DO.  Called Nurse Triage reporting Rectal Bleeding.  Symptoms began yesterday.  Interventions attempted: Rest, hydration, or home remedies.  Symptoms are: unchanged.  Triage Disposition: See PCP When Office is Open (Within 3 Days)  Patient/caregiver understands and will follow disposition?: YesCopied from CRM [id number]. Topic: Clinical - Red Word Triage >> Feb 27, 2024  8:34 AM Deleta RAMAN wrote: Red Word that prompted transfer to Nurse Triage: vomiting, cramping, and blood in stool Reason for Disposition  MILD rectal bleeding (e.g., more than just a few drops or streaks)  Answer Assessment - Initial Assessment Questions 1. APPEARANCE of BLOOD: What color is it? Is it passed separately, on the surface of the stool, or mixed in with the stool?      Bright red 2. AMOUNT: How much blood was passed?      Not much 3. FREQUENCY: How many times has blood been passed with the stools?      2 4. ONSET: When was the blood first seen in the stools? (Days or weeks)      yesterday 5. DIARRHEA: Is there also some diarrhea? If Yes, ask: How many diarrhea stools in the past 24 hours?      No  6. CONSTIPATION: Do you have constipation? If Yes, ask: How bad is it?     no 7. RECURRENT SYMPTOMS: Have you had blood in your stools before? If Yes, ask: When was the last time? and What happened that time?      Na  8. BLOOD THINNERS: Do you take any blood thinners? (e.g., aspirin, clopidogrel / Plavix, coumadin, heparin). Notes: Other strong blood thinners include: Arixtra (fondaparinux), Eliquis (apixaban), Pradaxa (dabigatran), and Xarelto (rivaroxaban).     no 9. OTHER SYMPTOMS: Do you have any other symptoms?  (e.g., abdomen pain, vomiting, dizziness, fever)     Nausea/vomiting      Blood on tissue with two  BM's yesterday. Pt does not feel external hemorrhoid. Blood bright red and small in amount. Pt has vomited 2x in last 24 hours. Pt is currently nauseous but does have appetite. Pt is having lower abd pain/cramping. Pt is taking Depo shot and hasn't had period in several years.Pt stated she took pregnancy test 3 weeks ago and was negative.  Next shot is due August/September. Pt is going to call to OBGYN to see if she can be seen today. Pt has app with PCP 7/11.  Protocols used: Rectal Bleeding-A-AH

## 2024-02-28 ENCOUNTER — Ambulatory Visit: Payer: Self-pay | Admitting: Family

## 2024-02-28 ENCOUNTER — Telehealth: Payer: Self-pay

## 2024-02-28 ENCOUNTER — Ambulatory Visit (HOSPITAL_BASED_OUTPATIENT_CLINIC_OR_DEPARTMENT_OTHER)
Admission: RE | Admit: 2024-02-28 | Discharge: 2024-02-28 | Disposition: A | Discharge status: Home / Self Care | Source: Ambulatory Visit | Attending: Family | Admitting: Family

## 2024-02-28 ENCOUNTER — Other Ambulatory Visit (HOSPITAL_BASED_OUTPATIENT_CLINIC_OR_DEPARTMENT_OTHER): Payer: Self-pay

## 2024-02-28 ENCOUNTER — Ambulatory Visit: Admitting: Family

## 2024-02-28 ENCOUNTER — Ambulatory Visit: Payer: Self-pay

## 2024-02-28 ENCOUNTER — Ambulatory Visit: Admitting: Family Medicine

## 2024-02-28 VITALS — BP 100/60 | HR 94 | Temp 98.4°F | Resp 18 | Ht 64.0 in | Wt 130.6 lb

## 2024-02-28 DIAGNOSIS — R102 Pelvic and perineal pain: Secondary | ICD-10-CM | POA: Diagnosis not present

## 2024-02-28 DIAGNOSIS — N3001 Acute cystitis with hematuria: Secondary | ICD-10-CM

## 2024-02-28 LAB — POCT URINE PREGNANCY: Preg Test, Ur: NEGATIVE

## 2024-02-28 LAB — POC URINALSYSI DIPSTICK (AUTOMATED)
Glucose, UA: NEGATIVE
Ketones, UA: POSITIVE
Leukocytes, UA: NEGATIVE
Nitrite, UA: POSITIVE
Protein, UA: NEGATIVE
Spec Grav, UA: 1.01 (ref 1.010–1.025)
Urobilinogen, UA: 0.2 U/dL
pH, UA: 6 (ref 5.0–8.0)

## 2024-02-28 MED ORDER — IOHEXOL 300 MG/ML  SOLN
100.0000 mL | Freq: Once | INTRAMUSCULAR | Status: AC | PRN
  Administered 2024-02-28: 100 mL via INTRAVENOUS

## 2024-02-28 MED ORDER — OXYCODONE-ACETAMINOPHEN 5-325 MG PO TABS
1.0000 | ORAL_TABLET | Freq: Four times a day (QID) | ORAL | 0 refills | Status: AC | PRN
  Filled 2024-02-28: qty 15, 4d supply, fill #0

## 2024-02-28 MED ORDER — CEPHALEXIN 500 MG PO CAPS: 500.0000 mg | ORAL_CAPSULE | Freq: Two times a day (BID) | ORAL | 0 refills | Status: DC

## 2024-02-28 NOTE — Assessment & Plan Note (Signed)
 Acute abdominal pain.  Differential includes kidney stone, diverticulitis, pyleonephritis, PID, ovarian cyst.  Needs CT for further evaluation. Will obtain baseline blood work as well as vaginal swab for STD screening.

## 2024-02-28 NOTE — Telephone Encounter (Signed)
 Copied from CRM [id number]. Topic: Clinical - Medical Advice >> Feb 28, 2024  7:49 AM Franky GRADE wrote: Reason for CRM: Patient was seen at an urgent care [facility] yesterday and an x-ray was completed, patient was advised that they found two calcification near her abdomen and a ct was required for further eval. She tried to schedule the CT scan but the next available date was 03/19/2024 and she would like to know if she can wait that long or if she should get it as soon as possible.

## 2024-02-28 NOTE — Patient Instructions (Signed)
 Start Keflex  (antibiotic) for possible urinary tract infection. It is possible that you passed a kidney stone.   You may use percocet as needed for severe pain- do not drive after taking.  Go to ER over the weekend if symptoms worsen and to let us  know if symptoms fail to improve with treatment.

## 2024-02-28 NOTE — Progress Notes (Signed)
 Subjective:     Patient ID: Erica Guzman, female    DOB: [birth year], 28 y.o.   MRN: [id number]  Chief Complaint  Patient presents with   Abdominal Pain    Pt went to UC yesterday and saw masses in her lower left abdomen and near right lung. Pt states having increasing pain, SOB, and dizziness.  Pt was given tramadol and not helping      Abdominal Pain    Discussed the use of AI scribe software for clinical note transcription with the patient, who gave verbal consent to proceed.  History of Present Illness  Erica Guzman is a 28 year old female who presents with severe abdominal pain and cramping.  Her symptoms began with light cramping early this week, initially thought to be related to hemorrhoids or vaginal bleeding. By Tuesday, she experienced bleeding when wiping, with an uncertain source. By Wednesday, the cramping intensified, particularly around 6:30 PM, despite taking ibuprofen earlier. Midol exacerbated the pain, which was extreme and accompanied by dizziness and vomiting. She has been on Depo-Provera and has not had period symptoms or a period for years.  On Thursday, she visited urgent care due to worsening symptoms. A urine sample showed white blood cells, and an x-ray revealed two calcifications. Report has been requested from [facility] but is not available at time of today's exam.  She was advised this AM by urgent care to schedule a CT scan. The pain is across her lower abdomen, extending to her back and diaphragm, making breathing difficult. She reports increased nighttime urination, waking two to three times a night since Monday.  She denies recent sexual activity within the last two weeks and has no concerns about STDs or new partners. She has a history of pelvic inflammatory disease in the remote past but notes her current symptoms feel different.     Health Maintenance Due  Topic Date Due   Hepatitis C Screening  Never done   Pneumococcal Vaccine  67-17 Years old (1 of 2 - PCV) Never done   COVID-19 Vaccine (2 - 2024-25 season) 04/21/2023    Past Medical History:  Diagnosis Date   GAD (generalized anxiety disorder)     Past Surgical History:  Procedure Laterality Date   TONSILLECTOMY  Age 47    Family History  Problem Relation Age of Onset   Bipolar disorder Mother    Diabetes Father    Lung cancer Maternal Grandmother        smoker   Dementia Maternal Grandfather    Heart disease Maternal Grandfather    Diabetes Maternal Grandfather    Breast cancer Neg Hx    Cervical cancer Neg Hx     Social History   Socioeconomic History   Marital status: Single    Spouse name: Not on file   Number of children: Not on file   Years of education: Not on file   Highest education level: Some college, no degree  Occupational History   Occupation: Consulting civil engineer    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 [employer]  Tobacco Use   Smoking status: Some Days    Types: E-cigarettes   Smokeless tobacco: Never  Substance and Sexual Activity   Alcohol use: Yes    Comment: 1-2 glasses of wine on occasions   Drug use: No    Comment: pt states has taken xanax x1,and adderall in past   Sexual activity: Yes    Partners: Male  Other Topics Concern   Not on  file  Social History Narrative   Not on file   Social Drivers of Health   Financial Resource Strain: Low Risk  (02/28/2024)   Overall Financial Resource Strain (CARDIA)    Difficulty of Paying Living Expenses: Not very hard  Food Insecurity: No Food Insecurity (02/28/2024)   Hunger Vital Sign    Worried About Running Out of Food in the Last Year: Never true    Ran Out of Food in the Last Year: Never true  Transportation Needs: No Transportation Needs (02/28/2024)   PRAPARE - Administrator, Civil Service (Medical): No    Lack of Transportation (Non-Medical): No  Physical Activity: Inactive (02/28/2024)   Exercise Vital Sign    Days of Exercise per Week: 0 days    Minutes of Exercise per  Session: Not on file  Stress: No Stress Concern Present (02/28/2024)   [employer] of Occupational Health - Occupational Stress Questionnaire    Feeling of Stress: Only a little  Social Connections: Socially Isolated (02/28/2024)   Social Connection and Isolation Panel    Frequency of Communication with Friends and Family: More than three times a week    Frequency of Social Gatherings with Friends and Family: Twice a week    Attends Religious Services: Never    Database administrator or Organizations: No    Attends Engineer, structural: Not on file    Marital Status: Never married  Intimate Partner Violence: Unknown (11/23/2021)   Received from [facility]   HITS    Physically Hurt: Not on file    Insult or Talk Down To: Not on file    Threaten Physical Harm: Not on file    Scream or Curse: Not on file    Outpatient Medications Prior to Visit  Medication Sig Dispense Refill   amitriptyline  (ELAVIL ) 25 MG tablet Take 1 tablet (25 mg total) by mouth at bedtime. 90 tablet 1   clonazePAM  (KLONOPIN ) 0.5 MG tablet Take 1 tablet (0.5 mg total) by mouth 2 (two) times daily as needed for anxiety. 20 tablet 1   Vitamin D , Ergocalciferol , (DRISDOL ) 1.25 MG (50000 UNIT) CAPS capsule Take 1 capsule (50,000 Units total) by mouth every 7 (seven) days. 12 capsule 0   No facility-administered medications prior to visit.    Allergies  Allergen Reactions   Penicillins Rash    Review of Systems  Gastrointestinal:  Positive for abdominal pain.       Objective:    Physical Exam Exam conducted with a chaperone present.  Constitutional:      General: She is not in acute distress.    Appearance: Normal appearance. She is well-developed.  HENT:     Head: Normocephalic and atraumatic.     Right Ear: External ear normal.     Left Ear: External ear normal.  Eyes:     General: No scleral icterus. Neck:     Thyroid : No thyromegaly.  Cardiovascular:     Rate and Rhythm: Normal  rate and regular rhythm.     Heart sounds: Normal heart sounds. No murmur heard. Pulmonary:     Effort: Pulmonary effort is normal. No respiratory distress.     Breath sounds: Normal breath sounds. No wheezing.  Abdominal:     General: Bowel sounds are normal.     Tenderness: There is abdominal tenderness in the suprapubic area and left lower quadrant.  Genitourinary:    Vagina: Normal.     Cervix: No cervical motion tenderness.  Uterus: Normal.      Adnexa: Right adnexa normal and left adnexa normal.       Right: No mass, tenderness or fullness.         Left: No mass, tenderness or fullness.    Musculoskeletal:     Cervical back: Neck supple.  Skin:    General: Skin is warm and dry.  Neurological:     Mental Status: She is alert and oriented to person, place, and time.  Psychiatric:        Mood and Affect: Mood normal.        Behavior: Behavior normal.        Thought Content: Thought content normal.        Judgment: Judgment normal.      BP 100/60   Pulse 94   Temp 98.4 F (36.9 C) (Oral)   Resp 18   Ht 5' 4 (1.626 m)   Wt 130 lb 9.6 oz (59.2 kg)   SpO2 100%   BMI 22.42 kg/m  Wt Readings from Last 3 Encounters:  02/28/24 130 lb 9.6 oz (59.2 kg)  01/07/24 125 lb 9.6 oz (57 kg)  11/26/23 125 lb (56.7 kg)       Assessment & Plan:   Problem List Items Addressed This Visit       Unprioritized   Pelvic pain - Primary   Acute abdominal pain.  Differential includes kidney stone, diverticulitis, pyleonephritis, PID, ovarian cyst.  Needs CT for further evaluation. Will obtain baseline blood work as well as vaginal swab for STD screening.         Relevant Medications   oxyCODONE -acetaminophen  (PERCOCET) 5-325 MG tablet   Other Relevant Orders   Cervicovaginal ancillary only( [place])   CT ABDOMEN PELVIS W CONTRAST (Completed)   Comp Met (CMET)   CBC w/Diff   Urine Culture   POCT Urinalysis Dipstick (Automated) (Completed)   POCT urine pregnancy  (Completed)   SureSwab Advanced Vaginitis Plus,TMA   Addendum: CT negative for acute findings. Reviewed negative CT results with patient.  Advised pt to start keflex  to treat for possible UTI.  Will await swab results but doubt PID based on normal bimanual exam, neg CMT.  Pt is advised to go to ER over the weekend if symptoms worsen and to let us  know if symptoms fail to improve with treatment. Pt verbalizes understanding. I am having Erica Guzman start on oxyCODONE -acetaminophen . I am also having her maintain her Vitamin D  (Ergocalciferol ), clonazePAM , and amitriptyline .  Meds ordered this encounter  Medications   oxyCODONE -acetaminophen  (PERCOCET) 5-325 MG tablet    Sig: Take 1 tablet by mouth every 6 (six) hours as needed for up to 5 days for severe pain (pain score 7-10).    Dispense:  15 tablet    Refill:  0    Supervising Provider:   DOMENICA BLACKBIRD A [[id number]]

## 2024-02-28 NOTE — Telephone Encounter (Signed)
  FYI Only or Action Required?: FYI only for provider.  Patient was last seen in primary care on 01/07/2024 by Frann Mabel Mt, DO.  Called Nurse Triage reporting Abdominal Pain.  Symptoms began several days ago.  Interventions attempted: Nothing.  Symptoms are: unchanged.  Triage Disposition: See Physician Within 24 Hours  Patient/caregiver understands and will follow disposition?: Yes  Apt today; was seen in UC yesterday; states can't stand the pain.   CT scheduled for end of month.  Xrays completed.   Copied from CRM [id number]. Topic: Clinical - Red Word Triage >> Feb 28, 2024 10:17 AM Mia F wrote: Red Word that prompted transfer to Nurse Triage: Experiencing pain and vomiting went to urgent care and something was found in xrays. Pt called this morning to see what she can do because pain is increasing. She is getting a ct in 3 weeks but cannot wait that long to know what's going on or for pain relief Reason for Disposition  [1] MODERATE pain (e.g., interferes with normal activities) AND [2] pain comes and goes (cramps) AND [3] present > 24 hours  (Exception: Pain with Vomiting or Diarrhea - see that Guideline.)  Answer Assessment - Initial Assessment Questions 1. LOCATION: Where does it hurt?      Was seen in UC yesterday and is waiting on Ct scan that is scheduled at the end of the month 2. RADIATION: Does the pain shoot anywhere else? (e.g., chest, back)     Yes, back and chest 3. ONSET: When did the pain begin? (e.g., minutes, hours or days ago)      Monday 4. SUDDEN: Gradual or sudden onset?     suddenly 5. PATTERN Does the pain come and go, or is it constant?     Comes and goes 6. SEVERITY: How bad is the pain?  (e.g., Scale 1-10; mild, moderate, or severe)     severe 7. RECURRENT SYMPTOM: Have you ever had this type of stomach pain before? If Yes, ask: When was the last time? and What happened that time?      no 8. CAUSE: What do you think is  causing the stomach pain? (e.g., gallstones, recent abdominal surgery)     unknown 9. RELIEVING/AGGRAVATING FACTORS: What makes it better or worse? (e.g., antacids, bending or twisting motion, bowel movement)     nothing 10. OTHER SYMPTOMS: Do you have any other symptoms? (e.g., back pain, diarrhea, fever, urination pain, vomiting)       Rectal bleeding, n/v 11. PREGNANCY: Is there any chance you are pregnant? When was your last menstrual period?       na  Protocols used: Abdominal Pain - Female-A-AH

## 2024-02-28 NOTE — Telephone Encounter (Signed)
 Reviewed negative CT results.  Advised pt to start keflex  to treat for possible UTI.  Will await swab results but doubt PID based on normal bimanual exam, neg CMT.  Pt is advised to go to ER over the weekend, let us  know if symptoms fail to improve with treatment.

## 2024-02-29 LAB — COMPREHENSIVE METABOLIC PANEL WITH GFR
AG Ratio: 1.9 (calc) (ref 1.0–2.5)
ALT: 15 U/L (ref 6–29)
AST: 16 U/L (ref 10–30)
Albumin: 4.6 g/dL (ref 3.6–5.1)
Alkaline phosphatase (APISO): 63 U/L (ref 31–125)
BUN: 17 mg/dL (ref 7–25)
CO2: 25 mmol/L (ref 20–32)
Calcium: 9.6 mg/dL (ref 8.6–10.2)
Chloride: 103 mmol/L (ref 98–110)
Creat: 0.86 mg/dL (ref 0.50–0.96)
Globulin: 2.4 g/dL (ref 1.9–3.7)
Glucose, Bld: 142 mg/dL — ABNORMAL HIGH (ref 65–99)
Potassium: 3.7 mmol/L (ref 3.5–5.3)
Sodium: 137 mmol/L (ref 135–146)
Total Bilirubin: 0.4 mg/dL (ref 0.2–1.2)
Total Protein: 7 g/dL (ref 6.1–8.1)
eGFR: 94 mL/min/1.73m2 (ref >=60)

## 2024-02-29 LAB — CBC WITH DIFFERENTIAL/PLATELET
Absolute Lymphocytes: 2183 {cells}/uL (ref 850–3900)
Absolute Monocytes: 354 {cells}/uL (ref 200–950)
Basophils Absolute: 41 {cells}/uL (ref 0–200)
Basophils Relative: 0.6 %
Eosinophils Absolute: 143 {cells}/uL (ref 15–500)
Eosinophils Relative: 2.1 %
HCT: 42.1 % (ref 35.0–45.0)
Hemoglobin: 13.9 g/dL (ref 11.7–15.5)
MCH: 30.7 pg (ref 27.0–33.0)
MCHC: 33 g/dL (ref 32.0–36.0)
MCV: 92.9 fL (ref 80.0–100.0)
MPV: 11.7 fL (ref 7.5–12.5)
Monocytes Relative: 5.2 %
Neutro Abs: 4080 {cells}/uL (ref 1500–7800)
Neutrophils Relative %: 60 %
Platelets: 279 Thousand/uL (ref 140–400)
RBC: 4.53 Million/uL (ref 3.80–5.10)
RDW: 12 % (ref 11.0–15.0)
Total Lymphocyte: 32.1 %
WBC: 6.8 Thousand/uL (ref 3.8–10.8)

## 2024-02-29 LAB — URINE CULTURE
MICRO NUMBER:: 16688099
Result:: NO GROWTH
SPECIMEN QUALITY:: ADEQUATE

## 2024-03-01 ENCOUNTER — Telehealth: Payer: Self-pay | Admitting: Family

## 2024-03-01 DIAGNOSIS — B9689 Other specified bacterial agents as the cause of diseases classified elsewhere: Secondary | ICD-10-CM

## 2024-03-01 LAB — SURESWAB® ADVANCED VAGINITIS PLUS,TMA
C. trachomatis RNA, TMA: NOT DETECTED
CANDIDA SPECIES: NOT DETECTED
Candida glabrata: NOT DETECTED
N. gonorrhoeae RNA, TMA: NOT DETECTED
SURESWAB(R) ADV BACTERIAL VAGINOSIS(BV),TMA: POSITIVE — AB
TRICHOMONAS VAGINALIS (TV),TMA: NOT DETECTED

## 2024-03-01 MED ORDER — METRONIDAZOLE 0.75 % VA GEL: 1.0000 | Freq: Every day | VAGINAL | 0 refills | Status: AC

## 2024-03-01 NOTE — Telephone Encounter (Signed)
 See mychart.

## 2024-03-03 ENCOUNTER — Ambulatory Visit (INDEPENDENT_AMBULATORY_CARE_PROVIDER_SITE_OTHER): Admitting: Family Medicine

## 2024-03-03 ENCOUNTER — Encounter: Payer: Self-pay | Admitting: Family Medicine

## 2024-03-03 VITALS — BP 110/70 | HR 95 | Temp 98.0°F | Resp 16 | Ht 64.0 in | Wt 129.0 lb

## 2024-03-03 DIAGNOSIS — M545 Low back pain, unspecified: Secondary | ICD-10-CM

## 2024-03-03 DIAGNOSIS — F411 Generalized anxiety disorder: Secondary | ICD-10-CM

## 2024-03-03 DIAGNOSIS — R1030 Lower abdominal pain, unspecified: Secondary | ICD-10-CM

## 2024-03-03 MED ORDER — METHYLPREDNISOLONE ACETATE 40 MG/ML IJ SUSP
40.0000 mg | Freq: Once | INTRAMUSCULAR | Status: AC
  Administered 2024-03-03: 40 mg via INTRAMUSCULAR

## 2024-03-03 MED ORDER — METHOCARBAMOL 500 MG PO TABS: 500.0000 mg | ORAL_TABLET | Freq: Three times a day (TID) | ORAL | 0 refills | Status: DC | PRN

## 2024-03-03 NOTE — Progress Notes (Signed)
 Chief Complaint  Patient presents with   Follow-up    Follow Up    Subjective Erica Guzman presents for f/u anxiety.  Pt is currently being treated with Klonopin  0.25-0.5 mg bid prn, Elavil  25 mg qhs. She failed propranolol  and hydroxyzine .  Reports doing better since treatment. No thoughts of harming self or others. No self-medication with alcohol, prescription drugs or illicit drugs. Pt is following with a counselor/psychologist.  Patient has also been dealing with bilateral lower abdominal pain radiating upwards.  It also is associated with bilateral low back pain.  Sharp in nature.  She had a CT scan of the unremarkable.  Urinary studies also negative for infection.  Her next available appointment with her GYN is in 2 months.  She denies any vaginal bleeding, urinary complaints, bowel changes, nausea/vomiting, or dietary changes.  Stress was normal.  As noted above, symptoms are currently controlled.  Tramadol, oxycodone , and ibuprofen offered limited relief at best.  Past Medical History:  Diagnosis Date   GAD (generalized anxiety disorder)    Allergies as of 03/03/2024       Reactions   Penicillins Rash        Medication List        Accurate as of March 03, 2024  9:19 AM. If you have any questions, ask your nurse or doctor.          STOP taking these medications    cephALEXin  500 MG capsule Commonly known as: KEFLEX  Stopped by: Mabel Deward Pry       TAKE these medications    amitriptyline  25 MG tablet Commonly known as: ELAVIL  Take 1 tablet (25 mg total) by mouth at bedtime.   clonazePAM  0.5 MG tablet Commonly known as: KLONOPIN  Take 1 tablet (0.5 mg total) by mouth 2 (two) times daily as needed for anxiety.   methocarbamol  500 MG tablet Commonly known as: ROBAXIN  Take 1 tablet (500 mg total) by mouth every 8 (eight) hours as needed for muscle spasms. Started by: Mabel Deward Pry   metroNIDAZOLE  0.75 % vaginal gel Commonly known as:  METROGEL  Place 1 Applicatorful vaginally at bedtime for 5 days.   oxyCODONE -acetaminophen  5-325 MG tablet Commonly known as: Percocet Take 1 tablet by mouth every 6 (six) hours as needed for up to 5 days for severe pain (pain score 7-10).   Vitamin D  (Ergocalciferol ) 1.25 MG (50000 UNIT) Caps capsule Commonly known as: DRISDOL  Take 1 capsule (50,000 Units total) by mouth every 7 (seven) days.        Exam BP 110/70 (BP Location: Left Arm, Patient Position: Sitting)   Pulse 95   Temp 98 F (36.7 C) (Oral)   Resp 16   Ht 5' 4 (1.626 m)   Wt 129 lb (58.5 kg)   SpO2 98%   BMI 22.14 kg/m  General:  well developed, well nourished, in no apparent distress Neuro: Gait is normal, DTRs equal and symmetric in the lower extremities, no clonus Heart: RRR Abdomen: Bowel sounds present, soft, nondistended, mild TTP to lower quadrants bilaterally. + [place] MSK: TTP in the lumbar paraspinal musculature bilaterally with hypertonicity.  No bony tenderness.  No SI joint TTP. Lungs: CTAB.  No respiratory distress Psych: well oriented with normal range of affect and age-appropriate judgement/insight, alert and oriented x4.  Assessment and Plan  Lower abdominal pain - Plan: methylPREDNISolone  acetate (DEPO-MEDROL ) injection 40 mg, methylPREDNISolone  acetate (DEPO-MEDROL ) injection 40 mg  Acute bilateral low back pain without sciatica - Plan: methocarbamol  (ROBAXIN ) 500 MG  tablet  GAD (generalized anxiety disorder)  1/2.  Will treat for acute low back pain/abdominal wall pain.  Nonsedating muscle relaxer as above.  Depo-Medrol  injection 80 mg today.  Heat, ice, Tylenol , stretches exercises.  Send message in the next couple weeks if no significant improvement.  Could consider TVUS.  Workup thus far was reviewed today. 3.  Chronic, now stable.  Continue Elavil  25 mg nightly, Klonopin  0.25 to 0.5 mg twice daily as needed.  Okay to consider counseling.  Counseled on exercise. F/u in 6 months. The  patient voiced understanding and agreement to the plan.  I spent 32 minutes with the patient discussing the above plans in addition to reviewing her chart on the same day of the visit.  Mabel Mt Rodney, DO 03/03/24 9:19 AM

## 2024-03-03 NOTE — Patient Instructions (Signed)

## 2024-03-05 ENCOUNTER — Other Ambulatory Visit: Payer: Self-pay | Admitting: Family Medicine

## 2024-03-05 ENCOUNTER — Encounter: Payer: Self-pay | Admitting: Family Medicine

## 2024-03-05 DIAGNOSIS — R1030 Lower abdominal pain, unspecified: Secondary | ICD-10-CM

## 2024-03-05 MED ORDER — DICYCLOMINE HCL 10 MG PO CAPS: ORAL_CAPSULE | ORAL | 0 refills | Status: DC

## 2024-03-18 ENCOUNTER — Other Ambulatory Visit (HOSPITAL_BASED_OUTPATIENT_CLINIC_OR_DEPARTMENT_OTHER): Admitting: Radiology

## 2024-03-23 ENCOUNTER — Ambulatory Visit (HOSPITAL_BASED_OUTPATIENT_CLINIC_OR_DEPARTMENT_OTHER)

## 2024-04-17 NOTE — Progress Notes (Signed)
 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  She just woke up with it.  It is some superior and some lateral and in the lateral arm.  She gets some numbness at times in the lateral arm when it hurts more.  Her neck does not hurt.  No numbness or tingling or pain going further down her arm.  It hurts more lying in bed and lifting.  Physical Exam: The patient is alert and attentive, pleasant and cooperative, well kempt, and of stated age.  Good affect.  No distress.  No definite swelling or atrophy.  Some tenderness in the trapezius and over at the subacromial space.  No tenderness below the clavicle or at the coracoclavicular ligament and no tenderness at the biceps.  She has a little bit increased anterior posterior translation of the shoulder but it is mild and it does not hurt.  She has full motion available with pain on full forward flexion and crossarm and impingement maneuvers.  Motion at her side does not hurt.  Stable in all directions.  Good strength in all distributions without pain.  Full motion of her neck in all planes without pain.  No focal motor deficit.  No definite generalized ligamentous instability.  Her knees and elbows hyperextend a little but she cannot touch her thumbs to her forearms or hyperextend her MCP joints 90 degrees.  Today I reviewed her labs.  On March 23, 2023 her creatinine 0.85 with a GFR over 90  Today I reviewed her note from [facility] at [facility] from March 17, 2024 as well as her note from Dr. Frann from November 26, 2023 and Jan 07, 2024  Today I ordered and interpreted right shoulder films   Assessment: Right subacromial bursitis   Plan: Mobic 7.5 mg daily and home rotator cuff strengthening exercises.   I reassured her chances are high this will do well.  Follow-up in 4 or 5 weeks for repeat examination including films of the right clavicle and cervical spine.  If she does not make good progress I would give strong consideration to subacromial injection and physical therapy.  Treatment risk category moderate.  X-ray: XR Shoulder Minimum 2 Views Right What looks like a faint ossification is at the coracoacromial ligament.   No fracture or bony lesion or arthritic change or other abnormality.  Impression: Normal right shoulder films except potential ossification at  the coracoacromial ligament.   Allergies  Penicillins  Medications    Current Medications[1]  Past Medical History  Medical History[2]  Past Surgical History  Surgical History[3]  Family History  Family History[4]  Social History:  Social History   Socioeconomic History  . Marital status: Single    Spouse name: Not on file  . Number of children: Not on file  . Years of education: Not on file  . Highest education level: Not on file  Occupational History  . Not on file  Tobacco Use  . Smoking status: Some Days  . Smokeless tobacco: Never  Vaping Use  . Vaping status: Some Days  Substance and Sexual Activity  . Alcohol use: Yes    Alcohol/week: 40.0 standard drinks of alcohol    Comment: Socially, 2 drinks per week  . Drug use: No  . Sexual activity:  Not Currently    Partners: Male    Birth control/protection: Injection  Other Topics Concern  . Not on file  Social History Narrative   Omya works for [employer].  She has a SO.  She lives with her father and step mom currently.    Social Drivers of Health   Food Insecurity: No Food Insecurity (02/28/2024)   Received from [facility]   Food vital sign   . Within the past 12 months, you worried that your food would run out before you got money to buy more: Never true   . Within the past 12 months, the food you bought just didn't last and you didn't  have money to get more: Never true  Transportation Needs: No Transportation Needs (02/28/2024)   Received from [facility] - Transportation   . In the past 12 months, has lack of transportation kept you from medical appointments or from getting medications?: No   . In the past 12 months, has lack of transportation kept you from meetings, work, or from getting things needed for daily living?: No  Safety: Low Risk  (03/23/2023)   Safety   . How often does anyone, including family and friends, physically hurt you?: Never   . How often does anyone, including family and friends, insult or talk down to you?: Never   . How often does anyone, including family and friends, threaten you with harm?: Never   . How often does anyone, including family and friends, scream or curse at you?: Never  Living Situation: Low Risk  (02/28/2024)   Received from [facility] Situation   . In the last 12 months, was there a time when you were not able to pay the mortgage or rent on time?: No   . In the past 12 months, how many times have you moved where you were living?: 0   . At any time in the past 12 months, were you homeless or living in a shelter (including now)?: No    Review of Systems   Vital Signs  BP 124/86   Pulse 66   Temp 97 F (36.1 C)  There is no height or weight on file to calculate BMI.  Test Results  No results found for this or any previous visit. No results found for this or any previous visit (from the past 48 hours).  Problem List  Problem List[5]     Ozell PARAS. Duwaine, MD       [1] Current Outpatient Medications  Medication Sig Dispense Refill  . amitriptyline  (ELAVIL ) 10 mg tablet Take 10 mg by mouth at bedtime.    . clonazePAM  (KlonoPIN ) 0.5 mg tablet Take 0.5 mg by mouth.    . meloxicam (MOBIC) 7.5 mg tablet Take 1 tablet (7.5 mg total) by mouth daily. 30 tablet 0   No current facility-administered medications for this visit.  [2] Past Medical  History: Diagnosis Date  . Anxiety   . Depression   [3] Past Surgical History: Procedure Laterality Date  . TONSILLECTOMY Bilateral    Procedure: TONSILLECTOMY  . WISDOM TOOTH EXTRACTION     Procedure: WISDOM TOOTH EXTRACTION  [4] Family History Problem Relation Name Age of Onset  . Cancer Maternal Grandmother    . Diabetes Maternal Grandfather         type 1  . Dementia Maternal Grandfather    . Diabetes Father         Type 2  .  Mental illness Mother         multiple personality disorder  . Bipolar disorder Mother    . ADD / ADHD Brother 1/2   . ADD / ADHD Brother 1/2   . Breast cancer Neg Hx    . Colon cancer Neg Hx    . Ovarian cancer Neg Hx    [5] Patient Active Problem List Diagnosis  . Depression  . Generalized anxiety disorder with panic attacks  . Chronic abdominal pain  . Hyperhidrosis  . Vitamin D  deficiency  . Subacromial bursitis of right shoulder joint

## 2024-04-24 NOTE — Progress Notes (Signed)
 Depo provera 150 mg given IM in [facility]OQ without complications  Next depo due: 07/10/24-07/24/24 Ce due 01/2025

## 2024-04-30 ENCOUNTER — Other Ambulatory Visit: Payer: Self-pay | Admitting: Family Medicine

## 2024-04-30 ENCOUNTER — Telehealth: Payer: Self-pay | Admitting: Family Medicine

## 2024-04-30 MED ORDER — GABAPENTIN 100 MG PO CAPS
100.0000 mg | ORAL_CAPSULE | Freq: Two times a day (BID) | ORAL | 1 refills | Status: AC | PRN
Start: 2024-04-30 — End: 2025-04-30

## 2024-04-30 MED ORDER — ARIPIPRAZOLE 2 MG PO TABS: 2.0000 mg | ORAL_TABLET | Freq: Every day | ORAL | 1 refills | Status: DC

## 2024-04-30 NOTE — Telephone Encounter (Unsigned)
 Copied from CRM [id number]. Topic: Clinical - Medication Question >> Apr 30, 2024  3:41 PM Donee H wrote: Reason for CRM: Leeroy from [facility] called on behalf of patient regarding 2 medications that were sent in today. Medication gabapentin  (NEURONTIN ) 100 MG capsule and ARIPiprazole  (ABILIFY ) 2 MG tablet by Pat Ohenhen. Pharmacy is stating patient never seen provider and don't understand what medication is for. She is wanting to know what medication is for. Please follow back up with pharmacy [phone] [facility]

## 2024-07-20 ENCOUNTER — Other Ambulatory Visit: Payer: Self-pay | Admitting: Family Medicine

## 2024-07-21 MED ORDER — CLONAZEPAM 0.5 MG PO TABS: 0.5000 mg | ORAL_TABLET | Freq: Two times a day (BID) | ORAL | 2 refills | Status: AC | PRN

## 2024-09-07 ENCOUNTER — Ambulatory Visit (INDEPENDENT_AMBULATORY_CARE_PROVIDER_SITE_OTHER): Admitting: Family Medicine

## 2024-09-07 ENCOUNTER — Other Ambulatory Visit: Payer: Self-pay

## 2024-09-07 ENCOUNTER — Ambulatory Visit: Payer: Self-pay | Admitting: Family Medicine

## 2024-09-07 ENCOUNTER — Encounter: Payer: Self-pay | Admitting: Family Medicine

## 2024-09-07 VITALS — BP 118/72 | HR 100 | Temp 98.0°F | Resp 16 | Ht 64.0 in | Wt 131.4 lb

## 2024-09-07 DIAGNOSIS — Z0000 Encounter for general adult medical examination without abnormal findings: Secondary | ICD-10-CM | POA: Diagnosis not present

## 2024-09-07 DIAGNOSIS — R7989 Other specified abnormal findings of blood chemistry: Secondary | ICD-10-CM | POA: Diagnosis not present

## 2024-09-07 DIAGNOSIS — D582 Other hemoglobinopathies: Secondary | ICD-10-CM

## 2024-09-07 DIAGNOSIS — F411 Generalized anxiety disorder: Secondary | ICD-10-CM | POA: Diagnosis not present

## 2024-09-07 LAB — COMPREHENSIVE METABOLIC PANEL WITH GFR
ALT: 15 U/L (ref 3–35)
AST: 15 U/L (ref 5–37)
Albumin: 4.8 g/dL (ref 3.5–5.2)
Alkaline Phosphatase: 54 U/L (ref 39–117)
BUN: 9 mg/dL (ref 6–23)
CO2: 23 meq/L (ref 19–32)
Calcium: 9.2 mg/dL (ref 8.4–10.5)
Chloride: 105 meq/L (ref 96–112)
Creatinine, Ser: 0.75 mg/dL (ref 0.40–1.20)
GFR: 108.18 mL/min
Glucose, Bld: 81 mg/dL (ref 70–99)
Potassium: 3.6 meq/L (ref 3.5–5.1)
Sodium: 138 meq/L (ref 135–145)
Total Bilirubin: 1 mg/dL (ref 0.2–1.2)
Total Protein: 6.9 g/dL (ref 6.0–8.3)

## 2024-09-07 LAB — CBC
HCT: 45 % (ref 36.0–46.0)
Hemoglobin: 15.2 g/dL — ABNORMAL HIGH (ref 12.0–15.0)
MCHC: 33.9 g/dL (ref 30.0–36.0)
MCV: 93.5 fl (ref 78.0–100.0)
Platelets: 273 K/uL (ref 150.0–400.0)
RBC: 4.81 Mil/uL (ref 3.87–5.11)
RDW: 13.8 % (ref 11.5–15.5)
WBC: 4.7 K/uL (ref 4.0–10.5)

## 2024-09-07 LAB — VITAMIN D 25 HYDROXY (VIT D DEFICIENCY, FRACTURES): VITD: 14.31 ng/mL — ABNORMAL LOW (ref 30.00–100.00)

## 2024-09-07 LAB — LIPID PANEL
Cholesterol: 164 mg/dL (ref 28–200)
HDL: 78.3 mg/dL
LDL Cholesterol: 67 mg/dL (ref 10–99)
NonHDL: 85.64
Total CHOL/HDL Ratio: 2
Triglycerides: 93 mg/dL (ref 10.0–149.0)
VLDL: 18.6 mg/dL (ref 0.0–40.0)

## 2024-09-07 MED ORDER — AMITRIPTYLINE HCL 50 MG PO TABS: 50.0000 mg | ORAL_TABLET | Freq: Every day | ORAL | 3 refills | Status: DC

## 2024-09-07 MED ORDER — VITAMIN D (ERGOCALCIFEROL) 1.25 MG (50000 UNIT) PO CAPS: 50000.0000 [IU] | ORAL_CAPSULE | ORAL | 0 refills | Status: AC

## 2024-09-07 NOTE — Patient Instructions (Addendum)
 Give [place] 2-3 business days to get the results of your labs back.   Keep the diet clean and stay active.  Aim to do some physical exertion for 150 minutes per week. This is typically divided into 5 days per week, 30 minutes per day. The activity should be enough to get your heart rate up. Anything is better than nothing if you have time constraints.  Please get me a copy of your advanced directive form at your convenience.   Let us know if you need anything.

## 2024-09-07 NOTE — Progress Notes (Signed)
 Chief Complaint  Patient presents with   Annual Exam    CPE     Well Woman Erica Guzman is here for a complete physical.   Her last physical was >1 year ago.  Current diet: in general, a healthy diet. Current exercise: none. Contraception? Yes Fatigue out of ordinary? No Seatbelt? Yes Advanced directive? No  Health Maintenance Pap/HPV- Yes Tetanus- Yes HIV screening- Yes Hep C screening- Yes  Anxiety: Significant other moved out in early December.  She is over the depressive phase but seems very restless.  She is seeing a therapist which has been helpful.  She is taking amitriptyline  25 mg nightly.  No adverse effects, helping sleep and anxiousness to some extent.  No IBS symptoms interestingly.  She has taken Klonopin  rarely, a few times in all of December despite her situation.  No homicidal or suicidal ideation.  No self-medication.    Past Medical History:  Diagnosis Date   GAD (generalized anxiety disorder)      Past Surgical History:  Procedure Laterality Date   TONSILLECTOMY  Age 76    Medications  Medications Ordered Prior to Encounter[1]   Allergies Allergies[2]  Review of Systems: Constitutional:  no unexpected weight changes Eye:  no recent significant change in vision Ear/Nose/Mouth/Throat:  Ears:  no tinnitus or vertigo and no recent change in hearing Nose/Mouth/Throat:  no complaints of nasal congestion, no sore throat Cardiovascular: no chest pain Respiratory:  no cough and no shortness of breath Gastrointestinal:  no abdominal pain, no change in bowel habits GU:  Female: negative for dysuria or pelvic pain Musculoskeletal/Extremities:  no pain of the joints Integumentary (Skin/Breast):  no abnormal skin lesions reported Neurologic:  no headaches Endocrine:  denies fatigue Hematologic/Lymphatic:  No areas of easy bleeding  Exam BP 118/72 (BP Location: Left Arm, Patient Position: Sitting)   Pulse 100   Temp 98 F (36.7 C) (Oral)   Resp 16    Ht 5' 4 (1.626 m)   Wt 131 lb 6.4 oz (59.6 kg)   SpO2 98%   BMI 22.55 kg/m  General:  well developed, well nourished, in no apparent distress Skin:  no significant moles, warts, or growths Head:  no masses, lesions, or tenderness Eyes:  pupils equal and round, sclera anicteric without injection Ears:  canals without lesions, TMs shiny without retraction, no obvious effusion, no erythema Nose:  nares patent, mucosa normal, and no drainage  Throat/Pharynx:  lips and gingiva without lesion; tongue and uvula midline; non-inflamed pharynx; no exudates or postnasal drainage Neck: neck supple without adenopathy, thyromegaly, or masses Lungs:  clear to auscultation, breath sounds equal bilaterally, no respiratory distress Cardio:  regular rate and rhythm, no bruits, no LE edema Abdomen:  abdomen soft, nontender; bowel sounds normal; no masses or organomegaly Genital: Defer to GYN Musculoskeletal:  symmetrical muscle groups noted without atrophy or deformity Extremities:  no clubbing, cyanosis, or edema, no deformities, no skin discoloration Neuro:  gait normal; deep tendon reflexes normal and symmetric Psych: well oriented with normal range of affect and appropriate judgment/insight  Assessment and Plan  Well adult exam - Plan: CBC, Comprehensive metabolic panel with GFR, Lipid panel  Low vitamin D  level - Plan: VITAMIN D  25 Hydroxy (Vit-D Deficiency, Fractures)  GAD (generalized anxiety disorder) - Plan: amitriptyline  (ELAVIL ) 50 MG tablet   [name]. Counseled on diet. Advanced directive form provided today.  Other orders as above. GAD: Chronic, not controlled.  Seems like she is describing psychomotor agitation.  Appreciate counseling team.  Increase amitriptyline  from 25 mg daily to 50 mg daily.  Recommended she start exercising routinely as well.  Will follow-up routinely but if not doing well, she will return in 1 month. Follow up in 6 mo. The patient voiced  understanding and agreement to the plan.  Erica [place], DO 09/07/24 8:21 AM     [1]  Current Outpatient Medications on File Prior to Visit  Medication Sig Dispense Refill   gabapentin  (NEURONTIN ) 100 MG capsule Take 1 capsule (100 mg total) by mouth 2 (two) times daily as needed. 180 capsule 1   clonazePAM  (KLONOPIN ) 0.5 MG tablet Take 1 tablet (0.5 mg total) by mouth 2 (two) times daily as needed for anxiety. 30 tablet 2   No current facility-administered medications on file prior to visit.  [2]  Allergies Allergen Reactions   Penicillins Rash

## 2024-09-11 ENCOUNTER — Other Ambulatory Visit: Payer: Self-pay | Admitting: Family Medicine

## 2024-09-11 MED ORDER — CITALOPRAM HYDROBROMIDE 10 MG PO TABS: 10.0000 mg | ORAL_TABLET | Freq: Every day | ORAL | 3 refills | Status: AC

## 2025-03-09 ENCOUNTER — Ambulatory Visit: Admitting: Family Medicine
